# Patient Record
Sex: Female | Born: 1955 | Race: White | Hispanic: No | Marital: Married | Smoking: Never smoker
Health system: Southern US, Community
[De-identification: ages and names within clinical notes are randomized; demographics above are authoritative.]

## PROBLEM LIST (undated history)

## (undated) DIAGNOSIS — H409 Unspecified glaucoma: Secondary | ICD-10-CM

## (undated) HISTORY — PX: SHOULDER SURGERY: SHX246

## (undated) HISTORY — PX: ROTATOR CUFF REPAIR: SHX139

---

## 2012-09-16 ENCOUNTER — Ambulatory Visit: Payer: Self-pay | Admitting: Emergency Medicine

## 2020-02-25 ENCOUNTER — Ambulatory Visit
Admission: EM | Admit: 2020-02-25 | Discharge: 2020-02-25 | Disposition: A | Discharge status: Home / Self Care | Payer: No Typology Code available for payment source | Attending: Internal Medicine | Admitting: Internal Medicine

## 2020-02-25 ENCOUNTER — Ambulatory Visit (INDEPENDENT_AMBULATORY_CARE_PROVIDER_SITE_OTHER): Payer: No Typology Code available for payment source

## 2020-02-25 DIAGNOSIS — S52501A Unspecified fracture of the lower end of right radius, initial encounter for closed fracture: Secondary | ICD-10-CM

## 2020-02-25 DIAGNOSIS — M25531 Pain in right wrist: Secondary | ICD-10-CM

## 2020-02-25 HISTORY — DX: Unspecified glaucoma: H40.9

## 2020-02-25 MED ORDER — IBUPROFEN 600 MG PO TABS: 600.0000 mg | ORAL_TABLET | Freq: Four times a day (QID) | ORAL | 0 refills | Status: AC | PRN

## 2020-02-25 NOTE — ED Provider Notes (Signed)
[facility]    CSN: [id number] Arrival date & time: 02/25/20  1124      History   Chief Complaint Chief Complaint  Patient presents with  . Arm Pain    right    HPI Julie Hunter is a 64 y.o. female comes to the urgent care with complaints of right wrist pain.  Patient was walking on her driveway and she lost a step.  She fell with outstretched hand and started experiencing pain in the right wrist.  Pain is sharp, severe, aggravated by movement and no relieving factors.  Associated with swelling of the right wrist.  Mild bruising of the wrist.  No numbness and tingling in the fingers.  Patient has tried ibuprofen with partial relief.  Fall happened last night. HPI  Past Medical History:  Diagnosis Date  . Glaucoma     There are no problems to display for this patient.   Past Surgical History:  Procedure Laterality Date  . ROTATOR CUFF REPAIR Right   . SHOULDER SURGERY Left     OB History   No obstetric history on file.      Home Medications    Prior to Admission medications   Medication Sig Start Date End Date Taking? Authorizing Provider  aspirin 81 MG chewable tablet Chew by mouth.   Yes [provider]  Cholecalciferol 25 MCG (1000 UT) tablet Take by mouth.   Yes [provider]  cyanocobalamin 1000 MCG tablet Take by mouth.   Yes [provider]  latanoprost (XALATAN) 0.005 % ophthalmic solution Apply to eye. 05/29/17  Yes [provider]  Lutein 20 MG TABS Take by mouth.   Yes [provider]  naproxen (NAPROSYN) 250 MG tablet Take by mouth.   Yes [provider]  Omega-3 Fatty Acids (FISH OIL) 1000 MG CAPS Take 1 capsule by mouth daily.   Yes [provider]  ibuprofen (ADVIL) 600 MG tablet Take 1 tablet (600 mg total) by mouth every 6 (six) hours as needed. 02/25/20   Avner Stroder, Myrene Galas, MD    Family History No family history on file.  Social History Social History   Tobacco  Use  . Smoking status: Never Smoker  . Smokeless tobacco: Never Used  Substance Use Topics  . Alcohol use: Never  . Drug use: Not on file     Allergies   Black walnut pollen allergy skin test, Pecan extract allergy skin test, and Other   Review of Systems Review of Systems  Respiratory: Negative.   Musculoskeletal: Positive for arthralgias and joint swelling. Negative for myalgias, neck pain and neck stiffness.  Skin: Negative.   Neurological: Negative.      Physical Exam Triage Vital Signs ED Triage Vitals  Enc Vitals Group     BP 02/25/20 1250 138/76     Pulse Rate 02/25/20 1250 66     Resp 02/25/20 1250 20     Temp 02/25/20 1250 98.5 F (36.9 C)     Temp Source 02/25/20 1250 Oral     SpO2 02/25/20 1250 100 %     Weight 02/25/20 1253 116 lb (52.6 kg)     Height 02/25/20 1253 5' 1.5" (1.562 m)     Head Circumference --      Peak Flow --      Pain Score 02/25/20 1254 9     Pain Loc --      Pain Edu? --      Excl. in [place]? --  No data found.  Updated Vital Signs BP 138/76 (BP Location: Left Arm)   Pulse 66   Temp 98.5 F (36.9 C) (Oral)   Resp 20   Ht 5' 1.5" (1.562 m)   Wt 52.6 kg   SpO2 100%   BMI 21.56 kg/m   Visual Acuity Right Eye Distance:   Left Eye Distance:   Bilateral Distance:    Right Eye Near:   Left Eye Near:    Bilateral Near:     Physical Exam Vitals and nursing note reviewed.  Constitutional:      General: She is not in acute distress.    Appearance: She is not ill-appearing.  Cardiovascular:     Rate and Rhythm: Normal rate and regular rhythm.     Pulses: Normal pulses.     Heart sounds: Normal heart sounds.  Pulmonary:     Effort: Pulmonary effort is normal.     Breath sounds: Normal breath sounds.  Musculoskeletal:        General: Swelling, tenderness, deformity and signs of injury present. Normal range of motion.  Neurological:     Mental Status: She is alert.      UC Treatments / Results  Labs (all labs  ordered are listed, but only abnormal results are displayed) Labs Reviewed - No data to display  EKG   Radiology DG Wrist Complete Right  Result Date: 02/25/2020 CLINICAL DATA:  [place] as deviating.  Persistent wrist pain. EXAM: RIGHT WRIST - COMPLETE 3+ VIEW COMPARISON:  None. FINDINGS: Nondisplaced distal radius fracture with mild dorsal impaction. No definite intra-articular involvement is identified. No ulnar styloid fracture. The carpal bones are intact.  No metacarpal fractures. IMPRESSION: Nondisplaced distal radius fracture with mild dorsal impaction. No definite intra-articular involvement or associated radial styloid fracture. Electronically Signed   By: Marijo Sanes M.D.   On: 02/25/2020 14:03    Procedures Procedures (including critical care time)  Medications Ordered in UC Medications - No data to display  Initial Impression / Assessment and Plan / UC Course  I have reviewed the triage vital signs and the nursing notes.  Pertinent labs & imaging results that were available during my care of the patient were reviewed by me and considered in my medical decision making (see chart for details).     1.  Closed fracture of the distal end of the right radius: Ibuprofen 600 mg every 6 hours as needed for pain Sugar tong short arm splint Patient has been given information to follow-up with orthopedic surgeon in 1 week Return precautions given Patient notices swelling of the fingers, duskiness of fingers, worsening pain she is advised to return to the urgent care to be reevaluated. Final Clinical Impressions(s) / UC Diagnoses   Final diagnoses:  Closed fracture of distal end of right radius, unspecified fracture morphology, initial encounter   Discharge Instructions   None    ED Prescriptions    Medication Sig Dispense Auth. Provider   ibuprofen (ADVIL) 600 MG tablet Take 1 tablet (600 mg total) by mouth every 6 (six) hours as needed. 30 tablet Rosmery Duggin, Myrene Galas, MD      PDMP not reviewed this encounter.   Chase Picket, MD 02/25/20 [phone]

## 2020-02-25 NOTE — ED Triage Notes (Addendum)
Patient in today w/ c/o right arm pain. Patient states she fell last evening on her arm. Patient has used ice for swelling and an ace wrap.   Patient has not been COVID vaccinated. Patient has decline vaccines at this time.

## 2020-07-19 ENCOUNTER — Other Ambulatory Visit: Payer: Self-pay | Admitting: Interventional Radiology

## 2020-07-19 DIAGNOSIS — C787 Secondary malignant neoplasm of liver and intrahepatic bile duct: Secondary | ICD-10-CM

## 2020-12-29 ENCOUNTER — Telehealth: Payer: No Typology Code available for payment source | Admitting: Medical

## 2021-10-12 IMAGING — CR DG WRIST COMPLETE 3+V*R*
4 series · 4 of 4 positions shown · non-contrast
Comparison: None.

CLINICAL DATA: Fell as deviating.  Persistent wrist pain.

EXAM:
RIGHT WRIST - COMPLETE 3+ VIEW

[wrist pa]
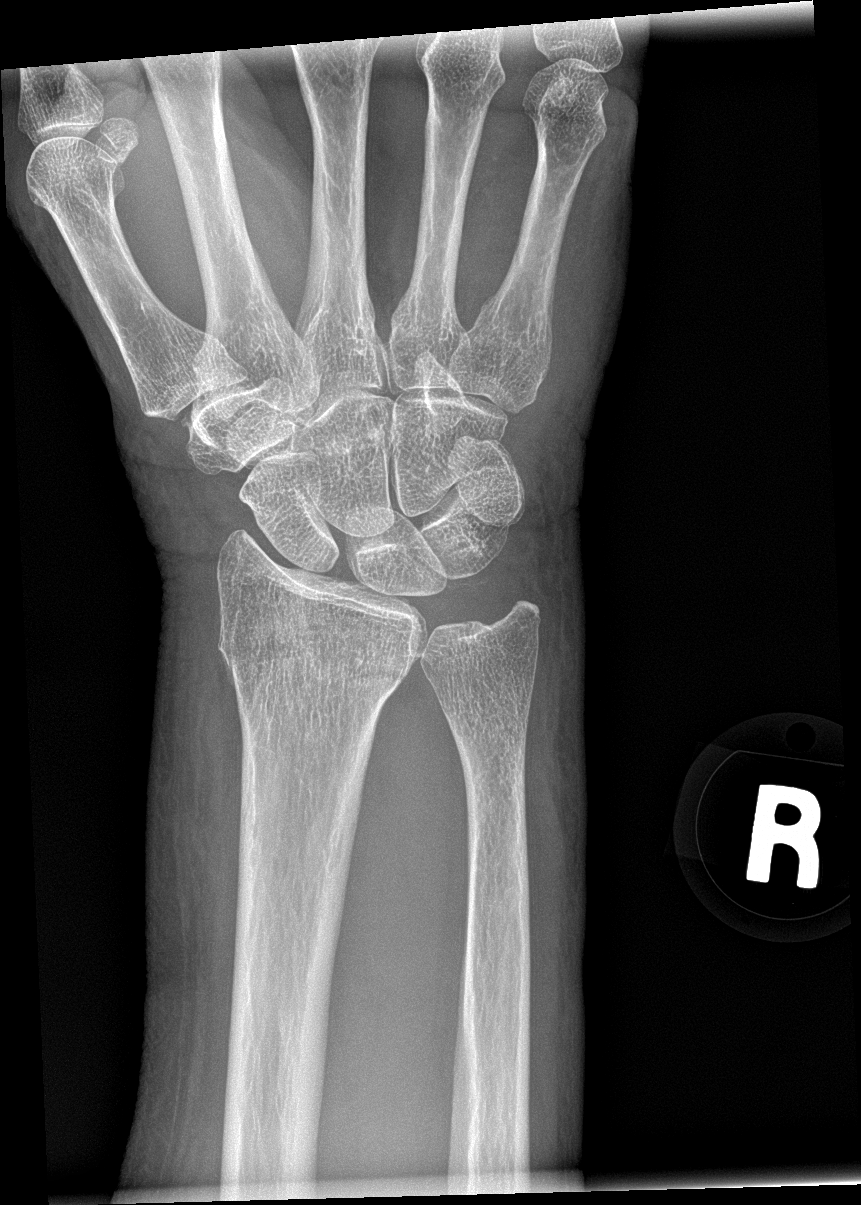

[wrist obl]
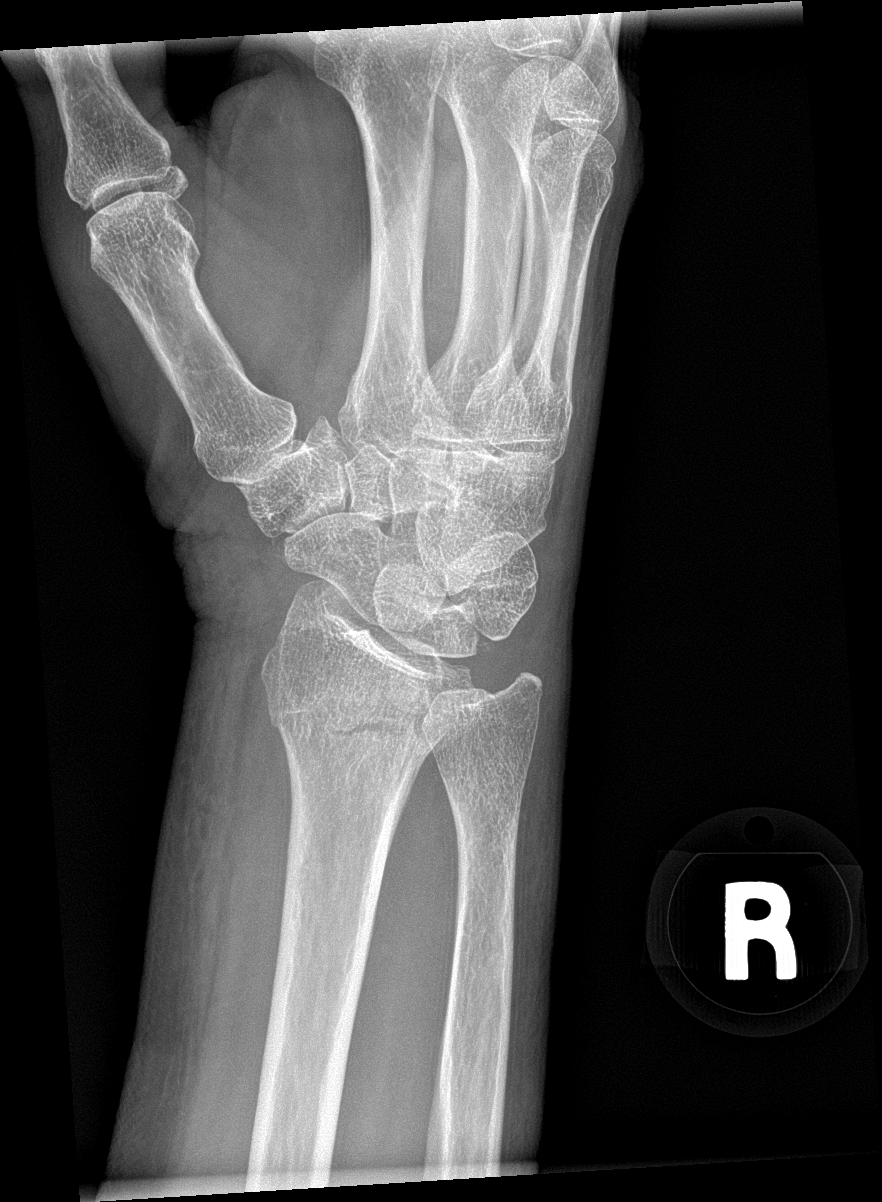

[wrist lat]
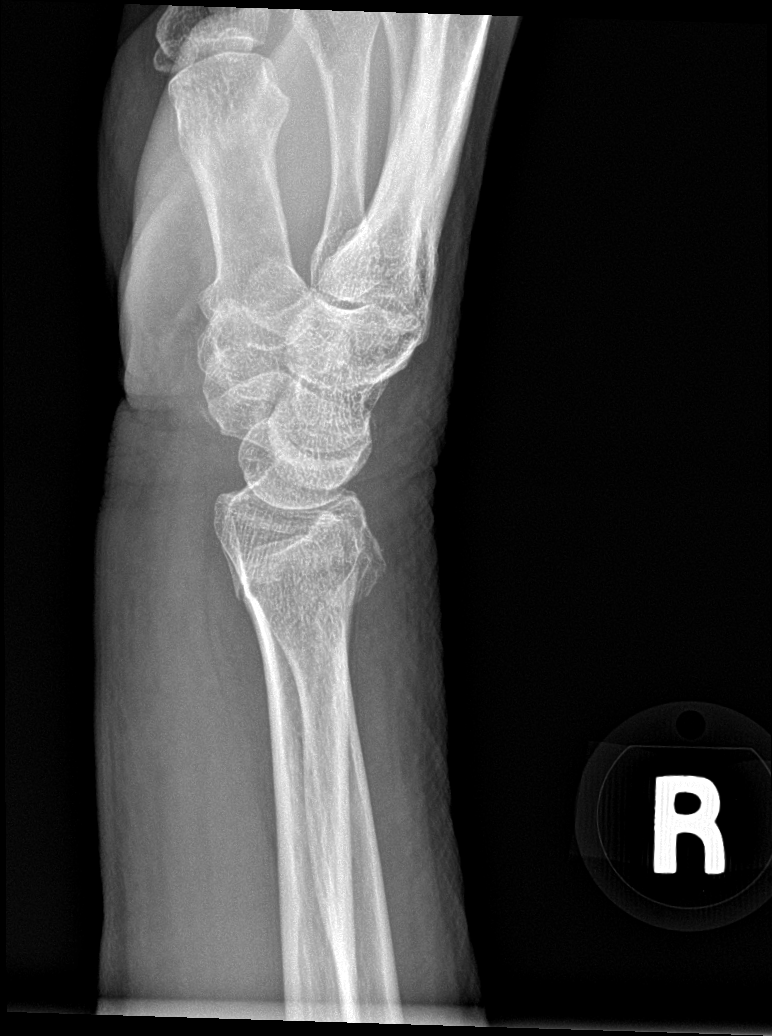

[wrist navicular]
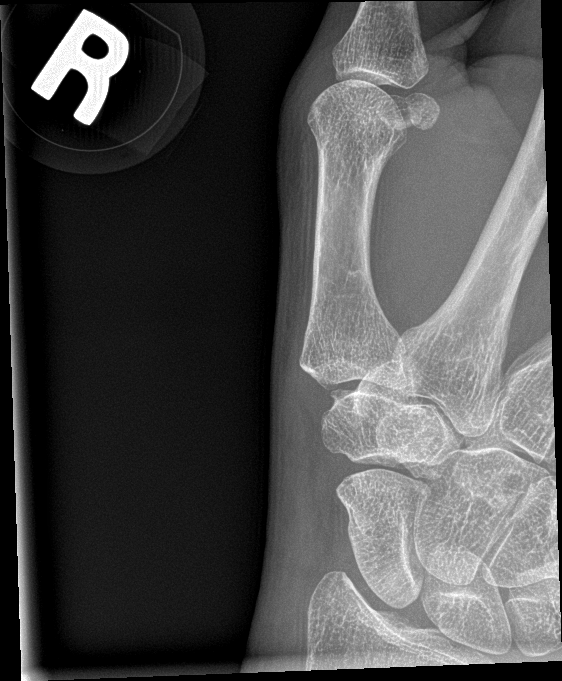

[4 of 4 positions shown; findings below may reference images not displayed]

FINDINGS: Nondisplaced distal radius fracture with mild dorsal impaction. No
definite intra-articular involvement is identified. No ulnar styloid
fracture.

The carpal bones are intact.  No metacarpal fractures.
IMPRESSION: Nondisplaced distal radius fracture with mild dorsal impaction. No
definite intra-articular involvement or associated radial styloid
fracture.

## 2022-02-13 NOTE — Therapy (Signed)
OUTPATIENT PHYSICAL THERAPY VESTIBULAR EVALUATION   Patient Name: Arwa Yero MRN: [id number] DOB:11/09/1955, 66 y.o., female Today's Date: 02/15/2022  PCP: Patient, No Pcp Per REFERRING PROVIDER: Molli Knock, MD   PT End of Session - 02/15/22 1052     Visit Number 1    Number of Visits 17    Date for PT Re-Evaluation 04/12/22    Authorization Type eval: 02/15/22    PT Start Time 1100    PT Stop Time 1145    PT Time Calculation (min) 45 min    Activity Tolerance Patient tolerated treatment well    Behavior During Therapy WFL for tasks assessed/performed             Past Medical History:  Diagnosis Date   Glaucoma    Past Surgical History:  Procedure Laterality Date   ROTATOR CUFF REPAIR Right    SHOULDER SURGERY Left    There are no problems to display for this patient.    PCP: Patient, No Pcp Per  REFERRING PROVIDER: Molli Knock, MD  REFERRING DIAGNOSIS: R42 (ICD-10-CM) - Dizziness and giddiness  THERAPY DIAG: Dizziness and giddiness - Plan: PT plan of care cert/re-cert  RATIONALE FOR EVALUATION AND TREATMENT: Rehabilitation  ONSET DATE: 03/10/2021 (approximate)  FOLLOW UP APPT WITH PROVIDER: No    SUBJECTIVE:   Chief Complaint:  Dizziness and unsteadiness  Pertinent History Pt referred secondary to complaints of occasional dizzy spells as well as imbalance. Symptoms first started sometime before last October and accelerated between her first and second COVID vaccination in the fall. She has noted progressive worsening of her balance as well as progressive difficulty with her short term memory. Pt now tries to hold her husbands arm when walking to facilitate balance and states that if she is holding his arm she is stable. Symptoms never occur when laying down, rolling in bed, or looking up. She denies any true vertigo. 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 Per ENT note pt had a blocked artery several years ago  and was placed on baby aspirin. She was also found to have bilateral sensorineural hearing loss and they are recommending hearing aids. Pt started noticing a blurry spot in her left eye in 2014 and while she was undergoing work-up by optho she had an MRI done which demonstrated brian atrophy, in particular the posterior fossa. She was referred to neurology and at that time she denied ever noticing any memory problems or gait difficulties. Per neurology note pt denied any family history of similar symptoms although mother did have dementia in late-life and father had what she thinks was PSP. She was followed by [place] neurology from 10/17/16 through 04/01/19. 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 Pt denies having seen neurology since 2020. According to the last neurology note there were initially concerns about a neurocognitive or neurodegenerative process. However, she has followed up for 2.5 years and she developed no abnormalities on her examination. 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 She was advised to follow up if she noticed any gait changes, falls, or more objective cognitive difficulties.   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   COMPARISON: None   TECHNIQUE/PROTOCOL: Standard orbits pre and post contrast protocol MRI  performed, including additional imaging pre and post contrast of the brain.   CONTRAST: 63m MultiHance IV. This MRI was performed before and after IV  administration of contrast material. IV contrast was administered to   improve disease detection and further define anatomy.  *    Complications:  No immediate patient complications or events noted.   FINDINGS:  Globes: Normal.  Lenses: Normal appearing native lenses.  Optic nerves: Normal.  Intraconal fat: Normal.  Intraocular hemorrhage: Normal.  Extraocular muscles: Normal.  Intraorbital vasculature: Normal.  Lacrimal glands: Normal.   Cerebrum: No mass, mass effect, acute infarct or hemorrhage. Markedly  advanced for age global cerebral atrophy, which is disproportionately worse  within the pons and medulla.  Intracranial flow voids: Normal.  Extra-axial spaces, basal cisterns, ventricles and sulci: Normal.  Cranium: Normal.  Paranasal sinuses: Normal.  Mastoid sinuses: Normal.   IMPRESSION:  1. Normal MRI of the orbits.  2. 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 This may be seen in multisystem atrophy, but  other etiologies are possible. Correlate clinically.    Description of dizziness: unsteadiness and whoozy Frequency: Multiple times throughout the day Duration: dizziness a few seconds but constant unsteadiness Symptom nature: intermittent, spontaneous, and positional Progression of symptoms since onset: worse, increasing frequency History of similar episodes: No  Provocative Factors: bending forward, sit to stand, turning quickly, no problems with laying down or rolling in bed Easing Factors:  waiting for symptoms to pass.  Auditory complaints (tinnitus, pain, drainage, hearing loss, aural fullness): Yes, pt reports hearing loss and occasional aural fullness but otherwise no changes; Vision changes (diplopia, visual field loss, recent changes, recent eye exam): Yes, chronic double vision intermittently when fatigued (in the last couple months) Chest pain/palpitations: No History of head injury/concussion: No Stress/anxiety: No Numbness/tingling: Denies Focal weakness:  Denies but reports progressive weakness in grip strength; Headaches/migraines: Denies  Has patient fallen in last 6 months? Yes, Number of falls: 1 Pertinent pain: No Dominant hand: right Imaging: Yes  Prior level of function: Independent for ADLs/IADLs Occupational demands: Not working Hobbies: Pt has animals on her property for which she cares  Red Flags: Positive: occasional dysphagia, weight loss (approximately 40# over the last 2-3 years, poor appetite and partially accounted by intentional weight loss), Denies: dysarthria, bowel and bladder changes, chills, fever, or night sweats;  PRECAUTIONS: None  WEIGHT BEARING RESTRICTIONS No  LIVING ENVIRONMENT: Lives with: lives with their spouse Lives in: House/apartment, 2 stories Stairs: Yes; 12-15 stairs inside railing on L during ascend Has following equipment at home: None  PATIENT GOALS: Improve balance, pt wants to be able to walk to the mailbox (gravel driveway about 0.5 miles);   OBJECTIVE  EXAMINATION  POSTURE: No gross deficits contributing to symptoms  NEUROLOGICAL SCREEN: (2+ unless otherwise noted.) N=normal  Ab=abnormal  Level Dermatome R L Myotome R L Reflex R L  C3 Anterior Neck N N Sidebend C2-3 N N Jaw CN V    C4 Top of Shoulder N N Shoulder Shrug C4 N N Hoffman's UMN    C5 Lateral Upper Arm N N Shoulder ABD C4-5 N N Biceps C5-6    C6 Lateral Arm/ Thumb N N Arm Flex/ Wrist Ext C5-6 N N Brachiorad. C5-6    C7 Middle Finger N N Arm Ext//Wrist Flex C6-7 N  N Triceps C7    C8 4th & 5th Finger N N Flex/ Ext Carpi Ulnaris C8 N N Patellar (L3-4)    T1 Medial Arm N N Interossei T1 N N Gastrocnemius    L2 Medial thigh/groin N N Illiopsoas (L2-3) N N     L3 Lower thigh/med.knee N N Quadriceps (L3-4) N N     L4 Medial leg/lat thigh N N Tibialis Ant (L4-5) N N     L5 Lat. leg & dorsal foot N N EHL (L5) N N     S1 post/lat foot/thigh/leg N N Gastrocnemius (S1-2) N N     S2 Post./med. thigh & leg N N Hamstrings (L4-S3)  N N       CRANIAL NERVES II, III, IV, VI: Pupils equal and reactive to light, visual acuity and visual fields are intact, extraocular muscles are intact  V: Facial sensation is intact and symmetric bilaterally  VII: Facial strength is intact and symmetric bilaterally  VIII: Hearing is normal as tested by gross conversation IX, X: Palate elevates midline, normal phonation, uvula midline XI: Shoulder shrug strength is intact  XII: Tongue protrudes midline    SOMATOSENSORY Grossly intact to light touch bilateral UEs/LEs as determined by testing dermatomes C2-T2 and L2-S2. Proprioception and hot/cold testing deferred on this date.   COORDINATION Finger to Nose: Dysmetria bilaterally Heel to Shin: Normal Pronator Drift: Positive on the right Rapid Alternating Movements: Normal Finger to Thumb Opposition: Normal Ataxic gait noted with wide base of support and pt leaning slightly backwards    RANGE OF MOTION Cervical Spine AROM WFL and painless in all planes. No focal deficits in AROM noted in BUE/BLE   MANUAL MUSCLE TESTING BUE/BLE strength WNL without focal deficits with the exception of 4/5 bilateral hip flexion, 4/5 bilateral ankle dorsiflexion, and decreased bilateral grip strength;   TRANSFERS/GAIT Independent for transfers and ambulation without assistive device    PATIENT SURVEYS FOTO: 50, predicted improvement to 58 ABC: To be completed   POSTURAL CONTROL TESTS  Clinical Test of Sensory Interaction for Balance (CTSIB): Deferred  OCULOMOTOR / VESTIBULAR TESTING  Oculomotor Exam- Room Light  Findings Comments  Ocular Alignment normal   Ocular ROM normal   Spontaneous Nystagmus normal   Gaze-Holding Nystagmus normal   End-Gaze Nystagmus normal   Vergence (normal 2-3") not examined   Smooth Pursuit abnormal Saccadic  Cross-Cover Test not examined   Saccades abnormal Slow and consistently hypometric  VOR Cancellation abnormal Consistent saccades  Left Head  Impulse abnormal Corrective saccade required  Right Head Impulse normal   Static Acuity not examined   Dynamic Acuity not examined     Oculomotor Exam- Fixation Suppressed: Deferred   BPPV TESTS:  Symptoms Duration Intensity Nystagmus  L Dix-Hallpike None   Downbeating (does not fatigue)  R Dix-Hallpike Oscillopsia 10-15s  Downbeating (does not fatigue)  L Head Roll None   None  R Head Roll None   None  L Sidelying Test      R Sidelying Test      (blank = not tested)   FUNCTIONAL OUTCOME MEASURES   Results Comments  BERG    DGI    FGA    TUG    5TSTS    6 Minute Walk Test    10 Meter Gait Speed    (blank = not tested)   TODAY'S TREATMENT  None    ASSESSMENT:  CLINICAL IMPRESSION: Patient is a 66 y.o. female who was seen today for physical therapy  evaluation and treatment for dizziness and unsteadiness. Objective impairments include Abnormal gait, decreased balance, decreased coordination, and dizziness. Significant concerns based on examination for central etiology of symptoms including abnormal smooth pursuit finger follow, slow and hypometric saccade testing, positive R pronator drift, bilateral UE dysmetria, ataxic gait, positive VOR cancellation testing, and downbeating nystagmus with oscillopsia in Hallpike position. Additional testing will be performed at first follow-up appointment. Pt was followed by [facility] from 2018-2020 due to concerns about a neurodegenerative process. It was eventually felt like her symptoms were not neurodegenerative in nature however pt was advised to follow-up if she developed any difficulty with unsteadiness and balance and she is now experiencing these symptoms. Therapist recommends pt follow-up with [place] [facility] however she would like to stay local to [facility]. Referral sent to [facility]. These impairments are limiting patient from shopping and community activity. Personal factors including Age,  Past/current experiences, Time since onset of injury/illness/exacerbation, and 1-2 comorbidities: depression and RTC tear  are also affecting patient's functional outcome. Patient will benefit from skilled PT to address above impairments and improve overall function.   REHAB POTENTIAL: Fair    CLINICAL DECISION MAKING: Evolving/moderate complexity  EVALUATION COMPLEXITY: Moderate   GOALS: Goals reviewed with patient? No  SHORT TERM GOALS: Target date: 03/15/2022  Pt will be independent with HEP for dizziness in order to decrease symptoms, improve balance,decrease fall risk, and improve function at home. Baseline: Goal status: INITIAL   LONG TERM GOALS: Target date: 04/12/2022  Pt will increase FOTO to at least 58 to demonstrate significant improvement in function at home related to dizziness.  Baseline: 02/15/22: 50 Goal status: INITIAL  2.  Pt will improve DGI by at least 3 points in order to demonstrate clinically significant improvement in balance and decreased risk for falls.     Baseline: 02/15/22: To be completed Goal status: INITIAL  3.  Pt will improve ABC by at least 13% in order to demonstrate clinically significant improvement in balance confidence.      Baseline: 02/15/22: To be completed Goal status: INITIAL  4. Pt will improve BERG by at least 3 points in order to demonstrate clinically significant improvement in balance and decreased risk for falls.     Baseline: 02/15/22: To be completed Goal status: INITIAL   PLAN:  PT FREQUENCY: 1-2x/week  PT DURATION: 8 weeks  PLANNED INTERVENTIONS: Therapeutic exercises, Therapeutic activity, Neuromuscular re-education, Balance training, Gait training, Patient/Family education, Joint manipulation, Joint mobilization, Canalith repositioning, Aquatic Therapy, Dry Needling, Cognitive remediation, Electrical stimulation, Spinal manipulation, Spinal mobilization, Cryotherapy, Moist heat, Traction, Ultrasound, Ionotophoresis '4mg'$ /ml  Dexamethasone, and Manual therapy  PLAN FOR NEXT SESSION: Have pt complete ABC, BERG, DGI, fixation suppression testing, mCTSIB, consider DVA, follow-up regarding referral to neurology   Lyndel Safe Syrah Daughtrey PT, DPT, GCS  Jheremy Boger 02/15/2022, 2:11 PM

## 2022-02-15 ENCOUNTER — Ambulatory Visit: Payer: Medicare Other | Attending: Otolaryngology

## 2022-02-15 DIAGNOSIS — R42 Dizziness and giddiness: Secondary | ICD-10-CM | POA: Diagnosis present

## 2022-02-15 DIAGNOSIS — R2681 Unsteadiness on feet: Secondary | ICD-10-CM | POA: Insufficient documentation

## 2022-02-16 NOTE — Therapy (Signed)
OUTPATIENT PHYSICAL THERAPY VESTIBULAR TREATMENT   Patient Name: Julie Hunter MRN: [id number] DOB:12/10/1955, 66 y.o., female Today's Date: 02/17/2022  PCP: Patient, No Pcp Per REFERRING PROVIDER: Molli Knock, MD   PT End of Session - 02/17/22 1021     Visit Number 2    Number of Visits 17    Date for PT Re-Evaluation 04/12/22    Authorization Type eval: 02/15/22    PT Start Time 1020    PT Stop Time 1100    PT Time Calculation (min) 40 min    Activity Tolerance Patient tolerated treatment well    Behavior During Therapy WFL for tasks assessed/performed              Past Medical History:  Diagnosis Date   Glaucoma    Past Surgical History:  Procedure Laterality Date   ROTATOR CUFF REPAIR Right    SHOULDER SURGERY Left    There are no problems to display for this patient.    PCP: Patient, No Pcp Per  REFERRING PROVIDER: Molli Knock, MD  REFERRING DIAGNOSIS: R42 (ICD-10-CM) - Dizziness and giddiness  THERAPY DIAG: Unsteadiness on feet  Dizziness and giddiness  RATIONALE FOR EVALUATION AND TREATMENT: Rehabilitation  ONSET DATE: 03/10/2021 (approximate)  FOLLOW UP APPT WITH PROVIDER: No    SUBJECTIVE:   Chief Complaint:  Dizziness and unsteadiness  Pertinent History Pt referred secondary to complaints of occasional dizzy spells as well as imbalance. Symptoms first started sometime before last October and accelerated between her first and second COVID vaccination in the fall. She has noted progressive worsening of her balance as well as progressive difficulty with her short term memory. Pt now tries to hold her husbands arm when walking to facilitate balance and states that if she is holding his arm she is stable. Symptoms never occur when laying down, rolling in bed, or looking up. She denies any true vertigo. 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 Per ENT note pt had a blocked artery several years ago and was  placed on baby aspirin. She was also found to have bilateral sensorineural hearing loss and they are recommending hearing aids. Pt started noticing a blurry spot in her left eye in 2014 and while she was undergoing work-up by optho she had an MRI done which demonstrated brian atrophy, in particular the posterior fossa. She was referred to neurology and at that time she denied ever noticing any memory problems or gait difficulties. Per neurology note pt denied any family history of similar symptoms although mother did have dementia in late-life and father had what she thinks was PSP. She was followed by [facility] from 10/17/16 through 04/01/19. 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 Pt denies having seen neurology since 2020. According to the last neurology note there were initially concerns about a neurocognitive or neurodegenerative process. However, she has followed up for 2.5 years and she developed no abnormalities on her examination. 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 She was advised to follow up if she noticed any gait changes, falls, or more objective cognitive difficulties.   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   COMPARISON: None   TECHNIQUE/PROTOCOL: Standard orbits pre and post contrast protocol MRI  performed, including additional imaging pre and post contrast of the brain.   CONTRAST: 68m MultiHance IV. This MRI was performed before and after IV  administration of contrast material. IV contrast was administered to  improve  disease detection and further define anatomy.  *    Complications:  No immediate patient complications or events noted.   FINDINGS:  Globes: Normal.  Lenses: Normal appearing native lenses.  Optic nerves: Normal.  Intraconal fat: Normal.  Intraocular hemorrhage: Normal.  Extraocular muscles: Normal.  Intraorbital vasculature: Normal.  Lacrimal glands: Normal.   Cerebrum: No mass, mass effect, acute infarct or hemorrhage. Markedly  advanced for age global cerebral atrophy, which is disproportionately worse  within the pons and medulla.  Intracranial flow voids: Normal.  Extra-axial spaces, basal cisterns, ventricles and sulci: Normal.  Cranium: Normal.  Paranasal sinuses: Normal.  Mastoid sinuses: Normal.   IMPRESSION:  1. Normal MRI of the orbits.  2. 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 This may be seen in multisystem atrophy, but  other etiologies are possible. Correlate clinically.    Description of dizziness: unsteadiness and whoozy Frequency: Multiple times throughout the day Duration: dizziness a few seconds but constant unsteadiness Symptom nature: intermittent, spontaneous, and positional Progression of symptoms since onset: worse, increasing frequency History of similar episodes: No  Provocative Factors: bending forward, sit to stand, turning quickly, no problems with laying down or rolling in bed Easing Factors:  waiting for symptoms to pass.  Auditory complaints (tinnitus, pain, drainage, hearing loss, aural fullness): Yes, pt reports hearing loss and occasional aural fullness but otherwise no changes; Vision changes (diplopia, visual field loss, recent changes, recent eye exam): Yes, chronic double vision intermittently when fatigued (in the last couple months) Chest pain/palpitations: No History of head injury/concussion: No Stress/anxiety: No Numbness/tingling: Denies Focal weakness: Denies but  reports progressive weakness in grip strength; Headaches/migraines: Denies  Has patient fallen in last 6 months? Yes, Number of falls: 1 Pertinent pain: No Dominant hand: right Imaging: Yes  Prior level of function: Independent for ADLs/IADLs Occupational demands: Not working Hobbies: Pt has animals on her property for which she cares  Red Flags: Positive: occasional dysphagia, weight loss (approximately 40# over the last 2-3 years, poor appetite and partially accounted by intentional weight loss), Denies: dysarthria, bowel and bladder changes, chills, fever, or night sweats;  PRECAUTIONS: None  WEIGHT BEARING RESTRICTIONS No  LIVING ENVIRONMENT: Lives with: lives with their spouse Lives in: House/apartment, 2 stories Stairs: Yes; 12-15 stairs inside railing on L during ascend Has following equipment at home: None  PATIENT GOALS: Improve balance, pt wants to be able to walk to the mailbox (gravel driveway about 0.5 miles);   OBJECTIVE  EXAMINATION  POSTURE: No gross deficits contributing to symptoms  NEUROLOGICAL SCREEN: (2+ unless otherwise noted.) N=normal  Ab=abnormal  Level Dermatome R L Myotome R L Reflex R L  C3 Anterior Neck N N Sidebend C2-3 N N Jaw CN V    C4 Top of Shoulder N N Shoulder Shrug C4 N N Hoffman's UMN    C5 Lateral Upper Arm N N Shoulder ABD C4-5 N N Biceps C5-6    C6 Lateral Arm/ Thumb N N Arm Flex/ Wrist Ext C5-6 N N Brachiorad. C5-6    C7 Middle Finger N N Arm Ext//Wrist Flex C6-7 N N Triceps  C7    C8 4th & 5th Finger N N Flex/ Ext Carpi Ulnaris C8 N N Patellar (L3-4)    T1 Medial Arm N N Interossei T1 N N Gastrocnemius    L2 Medial thigh/groin N N Illiopsoas (L2-3) N N     L3 Lower thigh/med.knee N N Quadriceps (L3-4) N N     L4 Medial leg/lat thigh N N Tibialis Ant (L4-5) N N     L5 Lat. leg & dorsal foot N N EHL (L5) N N     S1 post/lat foot/thigh/leg N N Gastrocnemius (S1-2) N N     S2 Post./med. thigh & leg N N Hamstrings (L4-S3) N N        CRANIAL NERVES II, III, IV, VI: Pupils equal and reactive to light, visual acuity and visual fields are intact, extraocular muscles are intact  V: Facial sensation is intact and symmetric bilaterally  VII: Facial strength is intact and symmetric bilaterally  VIII: Hearing is normal as tested by gross conversation IX, X: Palate elevates midline, normal phonation, uvula midline XI: Shoulder shrug strength is intact  XII: Tongue protrudes midline    SOMATOSENSORY Grossly intact to light touch bilateral UEs/LEs as determined by testing dermatomes C2-T2 and L2-S2. Proprioception and hot/cold testing deferred on this date.   COORDINATION Finger to Nose: Dysmetria bilaterally Heel to Shin: Normal Pronator Drift: Positive on the right Rapid Alternating Movements: Normal Finger to Thumb Opposition: Normal Ataxic gait noted with wide base of support and pt leaning slightly backwards    RANGE OF MOTION Cervical Spine AROM WFL and painless in all planes. No focal deficits in AROM noted in BUE/BLE   MANUAL MUSCLE TESTING BUE/BLE strength WNL without focal deficits with the exception of 4/5 bilateral hip flexion, 4/5 bilateral ankle dorsiflexion, and decreased bilateral grip strength;   TRANSFERS/GAIT Independent for transfers and ambulation without assistive device    PATIENT SURVEYS FOTO: 50, predicted improvement to 58 ABC: To be completed   POSTURAL CONTROL TESTS  Clinical Test of Sensory Interaction for Balance (CTSIB): Deferred  OCULOMOTOR / VESTIBULAR TESTING  Oculomotor Exam- Room Light  Findings Comments  Ocular Alignment normal   Ocular ROM normal   Spontaneous Nystagmus normal   Gaze-Holding Nystagmus normal   End-Gaze Nystagmus normal   Vergence (normal 2-3") not examined   Smooth Pursuit abnormal Saccadic  Cross-Cover Test not examined   Saccades abnormal Slow and consistently hypometric  VOR Cancellation abnormal Consistent saccades  Left Head Impulse  abnormal Corrective saccade required  Right Head Impulse normal   Static Acuity not examined   Dynamic Acuity not examined     Oculomotor Exam- Fixation Suppressed: Deferred   BPPV TESTS:  Symptoms Duration Intensity Nystagmus  L Dix-Hallpike None   Downbeating (does not fatigue)  R Dix-Hallpike Oscillopsia 10-15s  Downbeating (does not fatigue)  L Head Roll None   None  R Head Roll None   None  L Sidelying Test      R Sidelying Test      (blank = not tested)   FUNCTIONAL OUTCOME MEASURES   Results Comments  BERG    DGI    FGA    TUG    5TSTS    6 Minute Walk Test    10 Meter Gait Speed    (blank = not tested)   TODAY'S TREATMENT   SUBJECTIVE: Pt states that she was bit by a lonestar tick in 2018 and she developed a meat allergy afterwards. She attributes  this to her weight loss over the last few years. She states that she never was tested afterwards for any tick-borne illness. She also reports that she forgot to report a fall in 2021 where she fractured her R forearm. She has not heart from neurology yet.     PAIN: Denies   Neuromuscular Re-education  NuStep L1-2 x 5 minutes for warm-up during history;  Additional outcome measures updated with patient: ABC: 48.1% BERG: 51/56 5TSTS: 8.9s FGA: 17/30  Modified Clinical Test of Sensory Interaction for Balance (mCTSIB):  CONDITION TIME SWAY  Eyes open, firm surface 30 seconds 2+  Eyes closed, firm surface 30 seconds 3+  Eyes open, foam surface 30 seconds 3+  Eyes closed, foam surface 6 seconds 4+    Oculomotor Exam- Fixation Suppressed  Findings Comments  Ocular Alignment normal   Spontaneous Nystagmus abnormal R horizontal beating nystagmus  Gaze-Holding Nystagmus abnormal Worsening R horizontal beating nystagmus with R gaze, Downbeating nystagmus with L gaze  End-Gaze Nystagmus abnormal See above  Head Shaking Nystagmus No change   Pressure-Induced Nystagmus No change   Hyperventilation Induced  Nystagmus not examined   Skull Vibration Induced Nystagmus not examined     PATIENT EDUCATION:  Education details: Results of outcome measures and additional testing; Person educated: Patient Education method: Explanation Education comprehension: verbalized understanding   ASSESSMENT:  CLINICAL IMPRESSION: Performed additional outcome measures testing with patient during visit today. She reports a very low balance confidence scoring 48.1% on the ABC Scale. Her static balance is fair to good with pt scoring 51/56 on the BERG however her dynamic balance on the FGA is severely impaired with pt scoring 17/30. She struggles the most with head turns during ambulation, eyes closed gait, and tandem stepping. She maintains her balance for 30s in conditions 1-3 of the mCTSIB however with significant sway with eyes closed and on foam with eyes open. She is only able to maintain her balance for 6s in condition 4 (foam, eyes closed). Performed oculomotor/vestibular fixation suppression testing during visit and pt demonstrates a spontaneous horizontal beating nystagmus which continues during R gaze however converts to downbeating during left gaze. Pt has not heard from neurology so phone number provided for patient to follow-up early next week if she has not received a call. Will perform MOCA test at next appointment and consider DVA. Will also plan to initiate balance exercises and issue HEP. Patient will benefit from skilled PT to address above impairments and improve overall function.   REHAB POTENTIAL: Fair    CLINICAL DECISION MAKING: Evolving/moderate complexity  EVALUATION COMPLEXITY: Moderate   GOALS: Goals reviewed with patient? No  SHORT TERM GOALS: Target date: 03/15/2022  Pt will be independent with HEP for dizziness in order to decrease symptoms, improve balance,decrease fall risk, and improve function at home. Baseline: Goal status: INITIAL   LONG TERM GOALS: Target date:  04/12/2022  Pt will increase FOTO to at least 58 to demonstrate significant improvement in function at home related to dizziness.  Baseline: 02/15/22: 50 Goal status: INITIAL  2.  Pt will improve FGA by at least 4 points in order to demonstrate clinically significant improvement in balance and decreased risk for falls.     Baseline: 02/15/22: To be completed; 02/17/22: 17/30 Goal status: Revised  3.  Pt will improve ABC by at least 13% in order to demonstrate clinically significant improvement in balance confidence.      Baseline: 02/15/22: To be completed; 02/17/22: 48.1% Goal status: INITIAL  4. Pt  will improve BERG by at least 3 points in order to demonstrate clinically significant improvement in balance and decreased risk for falls.     Baseline: 02/15/22: To be completed, 02/17/22: 51/56 Goal status: INITIAL   PLAN:  PT FREQUENCY: 1-2x/week  PT DURATION: 8 weeks  PLANNED INTERVENTIONS: Therapeutic exercises, Therapeutic activity, Neuromuscular re-education, Balance training, Gait training, Patient/Family education, Joint manipulation, Joint mobilization, Canalith repositioning, Aquatic Therapy, Dry Needling, Cognitive remediation, Electrical stimulation, Spinal manipulation, Spinal mobilization, Cryotherapy, Moist heat, Traction, Ultrasound, Ionotophoresis '4mg'$ /ml Dexamethasone, and Manual therapy  PLAN FOR NEXT SESSION:  consider DVA, MOCA, Initiate balance therapy with patient, follow-up regarding referral to neurology   Lyndel Safe Ricquel Foulk PT, DPT, GCS  Ammie Warrick 02/17/2022, 11:32 AM

## 2022-02-17 ENCOUNTER — Ambulatory Visit: Payer: Medicare Other

## 2022-02-17 DIAGNOSIS — R42 Dizziness and giddiness: Secondary | ICD-10-CM | POA: Diagnosis not present

## 2022-02-17 DIAGNOSIS — R2681 Unsteadiness on feet: Secondary | ICD-10-CM

## 2022-02-20 NOTE — Therapy (Signed)
OUTPATIENT PHYSICAL THERAPY VESTIBULAR TREATMENT   Patient Name: Julie Hunter MRN: [id number] DOB:[birth year], 66 y.o., female Today's Date: 02/21/2022  PCP: Patient, No Pcp Per REFERRING PROVIDER: Molli Knock, MD   PT End of Session - 02/21/22 1300     Visit Number 3    Number of Visits 17    Date for PT Re-Evaluation 04/12/22    Authorization Type eval: 02/15/22    PT Start Time 1015    PT Stop Time 1100    PT Time Calculation (min) 45 min    Activity Tolerance Patient tolerated treatment well    Behavior During Therapy WFL for tasks assessed/performed               Past Medical History:  Diagnosis Date   Glaucoma    Past Surgical History:  Procedure Laterality Date   ROTATOR CUFF REPAIR Right    SHOULDER SURGERY Left    There are no problems to display for this patient.    PCP: Patient, No Pcp Per  REFERRING PROVIDER: Molli Knock, MD  REFERRING DIAGNOSIS: R42 (ICD-10-CM) - Dizziness and giddiness  THERAPY DIAG: Unsteadiness on feet  Dizziness and giddiness  RATIONALE FOR EVALUATION AND TREATMENT: Rehabilitation  ONSET DATE: 03/10/2021 (approximate)  FOLLOW UP APPT WITH PROVIDER: No    SUBJECTIVE:   Chief Complaint:  Dizziness and unsteadiness  Pertinent History Pt referred secondary to complaints of occasional dizzy spells as well as imbalance. Symptoms first started sometime before last October and accelerated between her first and second COVID vaccination in the fall. She has noted progressive worsening of her balance as well as progressive difficulty with her short term memory. Pt now tries to hold her husbands arm when walking to facilitate balance and states that if she is holding his arm she is stable. Symptoms never occur when laying down, rolling in bed, or looking up. She denies any true vertigo. 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 Per ENT note pt had a blocked artery several years ago and was  placed on baby aspirin. She was also found to have bilateral sensorineural hearing loss and they are recommending hearing aids. Pt started noticing a blurry spot in her left eye in 2014 and while she was undergoing work-up by optho she had an MRI done which demonstrated brian atrophy, in particular the posterior fossa. She was referred to neurology and at that time she denied ever noticing any memory problems or gait difficulties. Per neurology note pt denied any family history of similar symptoms although mother did have dementia in late-life and father had what she thinks was PSP. She was followed by [facility] from 10/17/16 through 04/01/19. 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 Pt denies having seen neurology since 2020. According to the last neurology note there were initially concerns about a neurocognitive or neurodegenerative process. However, she has followed up for 2.5 years and she developed no abnormalities on her examination. 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 She was advised to follow up if she noticed any gait changes, falls, or more objective cognitive difficulties.   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   COMPARISON: None   TECHNIQUE/PROTOCOL: Standard orbits pre and post contrast protocol MRI  performed, including additional imaging pre and post contrast of the brain.   CONTRAST: 20m MultiHance IV. This MRI was performed before and after IV  administration of contrast material. IV contrast was administered to  improve  disease detection and further define anatomy.  *    Complications:  No immediate patient complications or events noted.   FINDINGS:  Globes: Normal.  Lenses: Normal appearing native lenses.  Optic nerves: Normal.  Intraconal fat: Normal.  Intraocular hemorrhage: Normal.  Extraocular muscles: Normal.  Intraorbital vasculature: Normal.  Lacrimal glands: Normal.   Cerebrum: No mass, mass effect, acute infarct or hemorrhage. Markedly  advanced for age global cerebral atrophy, which is disproportionately worse  within the pons and medulla.  Intracranial flow voids: Normal.  Extra-axial spaces, basal cisterns, ventricles and sulci: Normal.  Cranium: Normal.  Paranasal sinuses: Normal.  Mastoid sinuses: Normal.   IMPRESSION:  1. Normal MRI of the orbits.  2. 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 This may be seen in multisystem atrophy, but  other etiologies are possible. Correlate clinically.    Description of dizziness: unsteadiness and whoozy Frequency: Multiple times throughout the day Duration: dizziness a few seconds but constant unsteadiness Symptom nature: intermittent, spontaneous, and positional Progression of symptoms since onset: worse, increasing frequency History of similar episodes: No  Provocative Factors: bending forward, sit to stand, turning quickly, no problems with laying down or rolling in bed Easing Factors:  waiting for symptoms to pass.  Auditory complaints (tinnitus, pain, drainage, hearing loss, aural fullness): Yes, pt reports hearing loss and occasional aural fullness but otherwise no changes; Vision changes (diplopia, visual field loss, recent changes, recent eye exam): Yes, chronic double vision intermittently when fatigued (in the last couple months) Chest pain/palpitations: No History of head injury/concussion: No Stress/anxiety: No Numbness/tingling: Denies Focal weakness: Denies but  reports progressive weakness in grip strength; Headaches/migraines: Denies  Has patient fallen in last 6 months? Yes, Number of falls: 1 Pertinent pain: No Dominant hand: right Imaging: Yes  Prior level of function: Independent for ADLs/IADLs Occupational demands: Not working Hobbies: Pt has animals on her property for which she cares  Red Flags: Positive: occasional dysphagia, weight loss (approximately 40# over the last 2-3 years, poor appetite and partially accounted by intentional weight loss), Denies: dysarthria, bowel and bladder changes, chills, fever, or night sweats;  PRECAUTIONS: None  WEIGHT BEARING RESTRICTIONS No  LIVING ENVIRONMENT: Lives with: lives with their spouse Lives in: House/apartment, 2 stories Stairs: Yes; 12-15 stairs inside railing on L during ascend Has following equipment at home: None  PATIENT GOALS: Improve balance, pt wants to be able to walk to the mailbox (gravel driveway about 0.5 miles);   OBJECTIVE  EXAMINATION  POSTURE: No gross deficits contributing to symptoms  NEUROLOGICAL SCREEN: (2+ unless otherwise noted.) N=normal  Ab=abnormal  Level Dermatome R L Myotome R L Reflex R L  C3 Anterior Neck N N Sidebend C2-3 N N Jaw CN V    C4 Top of Shoulder N N Shoulder Shrug C4 N N Hoffman's UMN    C5 Lateral Upper Arm N N Shoulder ABD C4-5 N N Biceps C5-6    C6 Lateral Arm/ Thumb N N Arm Flex/ Wrist Ext C5-6 N N Brachiorad. C5-6    C7 Middle Finger N N Arm Ext//Wrist Flex C6-7 N N  Triceps C7    C8 4th & 5th Finger N N Flex/ Ext Carpi Ulnaris C8 N N Patellar (L3-4)    T1 Medial Arm N N Interossei T1 N N Gastrocnemius    L2 Medial thigh/groin N N Illiopsoas (L2-3) N N     L3 Lower thigh/med.knee N N Quadriceps (L3-4) N N     L4 Medial leg/lat thigh N N Tibialis Ant (L4-5) N N     L5 Lat. leg & dorsal foot N N EHL (L5) N N     S1 post/lat foot/thigh/leg N N Gastrocnemius (S1-2) N N     S2 Post./med. thigh & leg N N Hamstrings (L4-S3) N N        CRANIAL NERVES II, III, IV, VI: Pupils equal and reactive to light, visual acuity and visual fields are intact, extraocular muscles are intact  V: Facial sensation is intact and symmetric bilaterally  VII: Facial strength is intact and symmetric bilaterally  VIII: Hearing is normal as tested by gross conversation IX, X: Palate elevates midline, normal phonation, uvula midline XI: Shoulder shrug strength is intact  XII: Tongue protrudes midline    SOMATOSENSORY Grossly intact to light touch bilateral UEs/LEs as determined by testing dermatomes C2-T2 and L2-S2. Proprioception and hot/cold testing deferred on this date.   COORDINATION Finger to Nose: Dysmetria bilaterally Heel to Shin: Normal Pronator Drift: Positive on the right Rapid Alternating Movements: Normal Finger to Thumb Opposition: Normal Ataxic gait noted with wide base of support and pt leaning slightly backwards    RANGE OF MOTION Cervical Spine AROM WFL and painless in all planes. No focal deficits in AROM noted in BUE/BLE   MANUAL MUSCLE TESTING BUE/BLE strength WNL without focal deficits with the exception of 4/5 bilateral hip flexion, 4/5 bilateral ankle dorsiflexion, and decreased bilateral grip strength;   TRANSFERS/GAIT Independent for transfers and ambulation without assistive device    PATIENT SURVEYS FOTO: 50, predicted improvement to 58 ABC: To be completed, 02/17/22: 48.1%   POSTURAL CONTROL TESTS  Clinical Test of Sensory Interaction for Balance (CTSIB): Deferred  OCULOMOTOR / VESTIBULAR TESTING  Oculomotor Exam- Room Light  Findings Comments  Ocular Alignment normal   Ocular ROM normal   Spontaneous Nystagmus normal   Gaze-Holding Nystagmus normal   End-Gaze Nystagmus normal   Vergence (normal 2-3") not examined   Smooth Pursuit abnormal Saccadic  Cross-Cover Test not examined   Saccades abnormal Slow and consistently hypometric  VOR Cancellation abnormal Consistent saccades   Left Head Impulse abnormal Corrective saccade required  Right Head Impulse normal   Static Acuity not examined   Dynamic Acuity not examined     Oculomotor Exam- Fixation Suppressed: Deferred   BPPV TESTS:  Symptoms Duration Intensity Nystagmus  L Dix-Hallpike None   Downbeating (does not fatigue)  R Dix-Hallpike Oscillopsia 10-15s  Downbeating (does not fatigue)  L Head Roll None   None  R Head Roll None   None  L Sidelying Test      R Sidelying Test      (blank = not tested)   Modified Clinical Test of Sensory Interaction for Balance (mCTSIB), 02/17/22:  CONDITION TIME SWAY  Eyes open, firm surface 30 seconds 2+  Eyes closed, firm surface 30 seconds 3+  Eyes open, foam surface 30 seconds 3+  Eyes closed, foam surface 6 seconds 4+    Oculomotor Exam- Fixation Suppressed (02/17/22)  Findings Comments  Ocular Alignment normal   Spontaneous Nystagmus abnormal R horizontal beating nystagmus  Gaze-Holding Nystagmus abnormal Worsening R horizontal beating nystagmus with R gaze, Downbeating nystagmus with L gaze  End-Gaze Nystagmus abnormal See above  Head Shaking Nystagmus No change   Pressure-Induced Nystagmus No change   Hyperventilation Induced Nystagmus not examined   Skull Vibration Induced Nystagmus not examined     FUNCTIONAL OUTCOME MEASURES   02/17/22 02/21/22 Comments  BERG 51/56  Mild balance deficit  DGI     FGA 17/30  Severe dynamic instability  TUG     5TSTS 8.9s  WNL  6 Minute Walk Test     10 Meter Gait Speed     MOCA  24/30   (blank = not tested)   TODAY'S TREATMENT   SUBJECTIVE: Pt states that she is doing well today. She continues with dizziness and imbalance. She has not heard from neurology yet and has not called to schedule and evaluation. She is planning to call soon if she doesn't hear from them. No specific questions or concerns currently.     PAIN: Denies   Neuromuscular Re-education  All balance exercises performed in // bars without  UE support unless otherwise specified: NuStep L1-2 x 7 minutes for warm-up during interval history; Completed MOCA with patient who scored: 24/30 Feet together eyes open/closed x 30s each; Feet together horizontal and vertical head turns x 30s each; 6" alternating toe taps x 15 BLE; Airex 6" alternating toe taps x 15 BLE; Airex feet together eyes open/closed x 30s each; Airex feet together horizontal and vertical head turns x 30s each; Tandem balance alternating forward LE x 30s each; Single leg balance x 30s on each LE; Issued and reviewed HEP with patient;   PATIENT EDUCATION:  Education details: Balance exercises, HEP Person educated: Patient Education method: Explanation and handout Education comprehension: verbalized understanding    HOME EXERCISE PROGRAM: Access Code: [email or web address] URL: [email or web address] Date: 02/21/2022 Prepared by: Roxana Hires  Exercises - Standing Tandem Balance with Counter Support  - 2 x daily - 7 x weekly - 3 x 30s with each foot forward hold - Standing Single Leg Stance with Counter Support  - 2 x daily - 7 x weekly - 3 x 30s on each foot hold   ASSESSMENT:  CLINICAL IMPRESSION: Performed MOCA testing with patient during visit today and she scored 24/30. Her most significant deficits were related to visuospatial/executive patterns and delayed recall. Additional time during session spent on balance exercises. Created HEP and issued to patient. Pt encouraged to call neurology to schedule an evaluation. Patient will benefit from skilled PT to address above impairments and improve overall function.   REHAB POTENTIAL: Fair    CLINICAL DECISION MAKING: Evolving/moderate complexity  EVALUATION COMPLEXITY: Moderate   GOALS: Goals reviewed with patient? No  SHORT TERM GOALS: Target date: 03/15/2022  Pt will be independent with HEP for dizziness in order to decrease symptoms, improve balance,decrease fall risk, and improve function  at home. Baseline: Goal status: INITIAL   LONG TERM GOALS: Target date: 04/12/2022  Pt will increase FOTO to at least 58 to demonstrate significant improvement in function at home related to dizziness.  Baseline: 02/15/22: 50 Goal status: INITIAL  2.  Pt will improve FGA by at least 4 points in order to demonstrate clinically significant improvement in balance and decreased risk for falls.     Baseline: 02/15/22: To be completed; 02/17/22: 17/30 Goal status: Revised  3.  Pt will improve ABC by at least 13% in order to demonstrate clinically significant improvement in balance confidence.  Baseline: 02/15/22: To be completed; 02/17/22: 48.1% Goal status: INITIAL  4. Pt will improve BERG by at least 3 points in order to demonstrate clinically significant improvement in balance and decreased risk for falls.     Baseline: 02/15/22: To be completed, 02/17/22: 51/56 Goal status: INITIAL   PLAN:  PT FREQUENCY: 1-2x/week  PT DURATION: 8 weeks  PLANNED INTERVENTIONS: Therapeutic exercises, Therapeutic activity, Neuromuscular re-education, Balance training, Gait training, Patient/Family education, Joint manipulation, Joint mobilization, Canalith repositioning, Aquatic Therapy, Dry Needling, Cognitive remediation, Electrical stimulation, Spinal manipulation, Spinal mobilization, Cryotherapy, Moist heat, Traction, Ultrasound, Ionotophoresis '4mg'$ /ml Dexamethasone, and Manual therapy  PLAN FOR NEXT SESSION:  consider DVA, continue balance therapy with patient, follow-up regarding referral to neurology   Lyndel Safe Nattaly Yebra PT, DPT, GCS  Ladora Osterberg 02/21/2022, 1:06 PM

## 2022-02-21 ENCOUNTER — Ambulatory Visit: Payer: Medicare Other

## 2022-02-21 DIAGNOSIS — R42 Dizziness and giddiness: Secondary | ICD-10-CM

## 2022-02-21 DIAGNOSIS — R2681 Unsteadiness on feet: Secondary | ICD-10-CM

## 2022-02-22 ENCOUNTER — Ambulatory Visit: Payer: Medicare Other

## 2022-02-22 NOTE — Therapy (Incomplete)
OUTPATIENT PHYSICAL THERAPY VESTIBULAR TREATMENT   Patient Name: Julie Hunter MRN: [id number] DOB:1956-05-10, 66 y.o., female Today's Date: 02/23/2022  PCP: Patient, No Pcp Per REFERRING PROVIDER: Molli Knock, MD   PT End of Session - 02/23/22 1621     Visit Number 4    Number of Visits 17    Date for PT Re-Evaluation 04/12/22    Authorization Type eval: 02/15/22    PT Start Time 1615    PT Stop Time 1700    PT Time Calculation (min) 45 min    Activity Tolerance Patient tolerated treatment well    Behavior During Therapy WFL for tasks assessed/performed              Past Medical History:  Diagnosis Date   Glaucoma    Past Surgical History:  Procedure Laterality Date   ROTATOR CUFF REPAIR Right    SHOULDER SURGERY Left    There are no problems to display for this patient.    PCP: Patient, No Pcp Per  REFERRING PROVIDER: Molli Knock, MD  REFERRING DIAGNOSIS: R42 (ICD-10-CM) - Dizziness and giddiness  THERAPY DIAG: Unsteadiness on feet  Dizziness and giddiness  RATIONALE FOR EVALUATION AND TREATMENT: Rehabilitation  ONSET DATE: 03/10/2021 (approximate)  FOLLOW UP APPT WITH PROVIDER: No    SUBJECTIVE:   Chief Complaint:  Dizziness and unsteadiness  Pertinent History Pt referred secondary to complaints of occasional dizzy spells as well as imbalance. Symptoms first started sometime before last October and accelerated between her first and second COVID vaccination in the fall. She has noted progressive worsening of her balance as well as progressive difficulty with her short term memory. Pt now tries to hold her husbands arm when walking to facilitate balance and states that if she is holding his arm she is stable. Symptoms never occur when laying down, rolling in bed, or looking up. She denies any true vertigo. 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 Per ENT note pt had a blocked artery several years ago and was  placed on baby aspirin. She was also found to have bilateral sensorineural hearing loss and they are recommending hearing aids. Pt started noticing a blurry spot in her left eye in 2014 and while she was undergoing work-up by optho she had an MRI done which demonstrated brian atrophy, in particular the posterior fossa. She was referred to neurology and at that time she denied ever noticing any memory problems or gait difficulties. Per neurology note pt denied any family history of similar symptoms although mother did have dementia in late-life and father had what she thinks was PSP. She was followed by [facility] from 10/17/16 through 04/01/19. 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 Pt denies having seen neurology since 2020. According to the last neurology note there were initially concerns about a neurocognitive or neurodegenerative process. However, she has followed up for 2.5 years and she developed no abnormalities on her examination. 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 She was advised to follow up if she noticed any gait changes, falls, or more objective cognitive difficulties.   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   COMPARISON: None   TECHNIQUE/PROTOCOL: Standard orbits pre and post contrast protocol MRI  performed, including additional imaging pre and post contrast of the brain.   CONTRAST: 51m MultiHance IV. This MRI was performed before and after IV  administration of contrast material. IV contrast was administered to  improve  disease detection and further define anatomy.  *    Complications:  No immediate patient complications or events noted.   FINDINGS:  Globes: Normal.  Lenses: Normal appearing native lenses.  Optic nerves: Normal.  Intraconal fat: Normal.  Intraocular hemorrhage: Normal.  Extraocular muscles: Normal.  Intraorbital vasculature: Normal.  Lacrimal glands: Normal.   Cerebrum: No mass, mass effect, acute infarct or hemorrhage. Markedly  advanced for age global cerebral atrophy, which is disproportionately worse  within the pons and medulla.  Intracranial flow voids: Normal.  Extra-axial spaces, basal cisterns, ventricles and sulci: Normal.  Cranium: Normal.  Paranasal sinuses: Normal.  Mastoid sinuses: Normal.   IMPRESSION:  1. Normal MRI of the orbits.  2. 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 This may be seen in multisystem atrophy, but  other etiologies are possible. Correlate clinically.    Description of dizziness: unsteadiness and whoozy Frequency: Multiple times throughout the day Duration: dizziness a few seconds but constant unsteadiness Symptom nature: intermittent, spontaneous, and positional Progression of symptoms since onset: worse, increasing frequency History of similar episodes: No  Provocative Factors: bending forward, sit to stand, turning quickly, no problems with laying down or rolling in bed Easing Factors:  waiting for symptoms to pass.  Auditory complaints (tinnitus, pain, drainage, hearing loss, aural fullness): Yes, pt reports hearing loss and occasional aural fullness but otherwise no changes; Vision changes (diplopia, visual field loss, recent changes, recent eye exam): Yes, chronic double vision intermittently when fatigued (in the last couple months) Chest pain/palpitations: No History of head injury/concussion: No Stress/anxiety: No Numbness/tingling: Denies Focal weakness: Denies but  reports progressive weakness in grip strength; Headaches/migraines: Denies  Has patient fallen in last 6 months? Yes, Number of falls: 1 Pertinent pain: No Dominant hand: right Imaging: Yes  Prior level of function: Independent for ADLs/IADLs Occupational demands: Not working Hobbies: Pt has animals on her property for which she cares  Red Flags: Positive: occasional dysphagia, weight loss (approximately 40# over the last 2-3 years, poor appetite and partially accounted by intentional weight loss), Denies: dysarthria, bowel and bladder changes, chills, fever, or night sweats;  PRECAUTIONS: None  WEIGHT BEARING RESTRICTIONS No  LIVING ENVIRONMENT: Lives with: lives with their spouse Lives in: House/apartment, 2 stories Stairs: Yes; 12-15 stairs inside railing on L during ascend Has following equipment at home: None  PATIENT GOALS: Improve balance, pt wants to be able to walk to the mailbox (gravel driveway about 0.5 miles);   OBJECTIVE  EXAMINATION  POSTURE: No gross deficits contributing to symptoms  NEUROLOGICAL SCREEN: (2+ unless otherwise noted.) N=normal  Ab=abnormal  Level Dermatome R L Myotome R L Reflex R L  C3 Anterior Neck N N Sidebend C2-3 N N Jaw CN V    C4 Top of Shoulder N N Shoulder Shrug C4 N N Hoffman's UMN    C5 Lateral Upper Arm N N Shoulder ABD C4-5 N N Biceps C5-6    C6 Lateral Arm/ Thumb N N Arm Flex/ Wrist Ext C5-6 N N Brachiorad. C5-6    C7 Middle Finger N N Arm Ext//Wrist Flex C6-7 N N Triceps  C7    C8 4th & 5th Finger N N Flex/ Ext Carpi Ulnaris C8 N N Patellar (L3-4)    T1 Medial Arm N N Interossei T1 N N Gastrocnemius    L2 Medial thigh/groin N N Illiopsoas (L2-3) N N     L3 Lower thigh/med.knee N N Quadriceps (L3-4) N N     L4 Medial leg/lat thigh N N Tibialis Ant (L4-5) N N     L5 Lat. leg & dorsal foot N N EHL (L5) N N     S1 post/lat foot/thigh/leg N N Gastrocnemius (S1-2) N N     S2 Post./med. thigh & leg N N Hamstrings (L4-S3) N N        CRANIAL NERVES II, III, IV, VI: Pupils equal and reactive to light, visual acuity and visual fields are intact, extraocular muscles are intact  V: Facial sensation is intact and symmetric bilaterally  VII: Facial strength is intact and symmetric bilaterally  VIII: Hearing is normal as tested by gross conversation IX, X: Palate elevates midline, normal phonation, uvula midline XI: Shoulder shrug strength is intact  XII: Tongue protrudes midline    SOMATOSENSORY Grossly intact to light touch bilateral UEs/LEs as determined by testing dermatomes C2-T2 and L2-S2. Proprioception and hot/cold testing deferred on this date.   COORDINATION Finger to Nose: Dysmetria bilaterally Heel to Shin: Normal Pronator Drift: Positive on the right Rapid Alternating Movements: Normal Finger to Thumb Opposition: Normal Ataxic gait noted with wide base of support and pt leaning slightly backwards    RANGE OF MOTION Cervical Spine AROM WFL and painless in all planes. No focal deficits in AROM noted in BUE/BLE   MANUAL MUSCLE TESTING BUE/BLE strength WNL without focal deficits with the exception of 4/5 bilateral hip flexion, 4/5 bilateral ankle dorsiflexion, and decreased bilateral grip strength;   TRANSFERS/GAIT Independent for transfers and ambulation without assistive device    PATIENT SURVEYS FOTO: 50, predicted improvement to 58 ABC: To be completed, 02/17/22: 48.1%   POSTURAL CONTROL TESTS  Clinical Test of Sensory Interaction for Balance (CTSIB): Deferred  OCULOMOTOR / VESTIBULAR TESTING  Oculomotor Exam- Room Light  Findings Comments  Ocular Alignment normal   Ocular ROM normal   Spontaneous Nystagmus normal   Gaze-Holding Nystagmus normal   End-Gaze Nystagmus normal   Vergence (normal 2-3") not examined   Smooth Pursuit abnormal Saccadic  Cross-Cover Test not examined   Saccades abnormal Slow and consistently hypometric  VOR Cancellation abnormal Consistent saccades   Left Head Impulse abnormal Corrective saccade required  Right Head Impulse normal   Static Acuity not examined   Dynamic Acuity not examined     Oculomotor Exam- Fixation Suppressed: Deferred   BPPV TESTS:  Symptoms Duration Intensity Nystagmus  L Dix-Hallpike None   Downbeating (does not fatigue)  R Dix-Hallpike Oscillopsia 10-15s  Downbeating (does not fatigue)  L Head Roll None   None  R Head Roll None   None  L Sidelying Test      R Sidelying Test      (blank = not tested)   Modified Clinical Test of Sensory Interaction for Balance (mCTSIB), 02/17/22:  CONDITION TIME SWAY  Eyes open, firm surface 30 seconds 2+  Eyes closed, firm surface 30 seconds 3+  Eyes open, foam surface 30 seconds 3+  Eyes closed, foam surface 6 seconds 4+    Oculomotor Exam- Fixation Suppressed (02/17/22)  Findings Comments  Ocular Alignment normal   Spontaneous Nystagmus abnormal R horizontal beating nystagmus  Gaze-Holding  Nystagmus abnormal Worsening R horizontal beating nystagmus with R gaze, Downbeating nystagmus with L gaze  End-Gaze Nystagmus abnormal See above  Head Shaking Nystagmus No change   Pressure-Induced Nystagmus No change   Hyperventilation Induced Nystagmus not examined   Skull Vibration Induced Nystagmus not examined     FUNCTIONAL OUTCOME MEASURES   02/17/22 02/21/22 Comments  BERG 51/56  Mild balance deficit  DGI     FGA 17/30  Severe dynamic instability  TUG     5TSTS 8.9s  WNL  6 Minute Walk Test     10 Meter Gait Speed     MOCA  24/30   (blank = not tested)   TODAY'S TREATMENT   SUBJECTIVE: Pt states that she is doing well today. She continues with dizziness and imbalance. She scheduled with neurology on 05/08/22 and is on a waiting list for a cancellation if something opens sooner. No specific questions or concerns currently.     PAIN: Denies   Neuromuscular Re-education  All balance exercises performed in // bars without UE support unless otherwise  specified: NuStep L1-2 x 5 minutes for warm-up during interval history;  DVA: Static: 20/25 (line 7) Dynamic: 20/70 (line 3)  Feet together eyes open/closed x 30s each; Feet together horizontal and vertical head turns x 30s each; 6" alternating toe taps x 15 BLE; Heel/toe raises x 10 each direction; Tandem balance alternating forward LE x 30s each; Tandem balance alternating forward LE with horizontal and vertical head turns x 30s each; Tandem gait 15' x 4; VOR x 1 horizontal in standing with feet apart and target at arms length on plain wall 3 x 60s with increase in dizziness after each rep;   Ther-ex  Sit to stand without UE support x 10, holding 4# med ball x 10; Squats with glute chair taps 2 x 10;   Not Performed: Airex 6" alternating toe taps x 15 BLE; Airex feet together eyes open/closed x 30s each; Airex feet together horizontal and vertical head turns x 30s each; Single leg balance x 30s on each LE;    PATIENT EDUCATION:  Education details: Balance exercises, Pt educated throughout session about proper posture and technique with exercises. Improved exercise technique, movement at target joints, use of target muscles after min to mod verbal, visual, tactile cues.  Person educated: Patient Education method: Explanation  Education comprehension: verbalized understanding    HOME EXERCISE PROGRAM: Access Code: [email or web address] URL: [email or web address] Date: 02/21/2022 Prepared by: Roxana Hires  Exercises - Standing Tandem Balance with Counter Support  - 2 x daily - 7 x weekly - 3 x 30s with each foot forward hold - Standing Single Leg Stance with Counter Support  - 2 x daily - 7 x weekly - 3 x 30s on each foot hold   ASSESSMENT:  CLINICAL IMPRESSION: Continued with adaptation, habituation, and balance exercises during session today. Positive DVA Test today with pt experiencing a 4 line loss. Pt continues to report dizziness during VOR x 1 horizontal.  Added strengthening exercises into session today. No HEP modifications on this date. Pt encouraged to follow-up as scheduled. Patient will benefit from skilled PT to address above impairments and improve overall function.   REHAB POTENTIAL: Fair    CLINICAL DECISION MAKING: Evolving/moderate complexity  EVALUATION COMPLEXITY: Moderate   GOALS: Goals reviewed with patient? No  SHORT TERM GOALS: Target date: 03/15/2022  Pt will be independent with HEP for dizziness in order to decrease symptoms, improve balance,decrease fall risk, and improve function  at home. Baseline: Goal status: INITIAL   LONG TERM GOALS: Target date: 04/12/2022  Pt will increase FOTO to at least 58 to demonstrate significant improvement in function at home related to dizziness.  Baseline: 02/15/22: 50 Goal status: INITIAL  2.  Pt will improve FGA by at least 4 points in order to demonstrate clinically significant improvement in balance and decreased risk for falls.     Baseline: 02/15/22: To be completed; 02/17/22: 17/30 Goal status: Revised  3.  Pt will improve ABC by at least 13% in order to demonstrate clinically significant improvement in balance confidence.      Baseline: 02/15/22: To be completed; 02/17/22: 48.1% Goal status: INITIAL  4. Pt will improve BERG by at least 3 points in order to demonstrate clinically significant improvement in balance and decreased risk for falls.     Baseline: 02/15/22: To be completed, 02/17/22: 51/56 Goal status: INITIAL   PLAN:  PT FREQUENCY: 1-2x/week  PT DURATION: 8 weeks  PLANNED INTERVENTIONS: Therapeutic exercises, Therapeutic activity, Neuromuscular re-education, Balance training, Gait training, Patient/Family education, Joint manipulation, Joint mobilization, Canalith repositioning, Aquatic Therapy, Dry Needling, Cognitive remediation, Electrical stimulation, Spinal manipulation, Spinal mobilization, Cryotherapy, Moist heat, Traction, Ultrasound, Ionotophoresis '4mg'$ /ml  Dexamethasone, and Manual therapy  PLAN FOR NEXT SESSION:  continue adaptation, habituation, and balance exercises with patient;   Phillips Grout PT, DPT, GCS  Julie Hunter 02/23/2022, 6:08 PM

## 2022-02-23 ENCOUNTER — Ambulatory Visit: Payer: Medicare Other

## 2022-02-23 DIAGNOSIS — R2681 Unsteadiness on feet: Secondary | ICD-10-CM

## 2022-02-23 DIAGNOSIS — R42 Dizziness and giddiness: Secondary | ICD-10-CM | POA: Diagnosis not present

## 2022-02-28 ENCOUNTER — Ambulatory Visit: Payer: Medicare Other

## 2022-02-28 DIAGNOSIS — R2681 Unsteadiness on feet: Secondary | ICD-10-CM

## 2022-02-28 DIAGNOSIS — R42 Dizziness and giddiness: Secondary | ICD-10-CM | POA: Diagnosis not present

## 2022-02-28 NOTE — Therapy (Signed)
OUTPATIENT PHYSICAL THERAPY VESTIBULAR TREATMENT   Patient Name: Julie Hunter MRN: [id number] DOB:04/30/1956, 66 y.o., female Today's Date: 02/28/2022  PCP: Patient, No Pcp Per REFERRING PROVIDER: Molli Knock, MD   PT End of Session - 02/28/22 1447     Visit Number 5    Number of Visits 17    Date for PT Re-Evaluation 04/12/22    Authorization Type eval: 02/15/22    PT Start Time 1446    PT Stop Time 1530    PT Time Calculation (min) 44 min    Activity Tolerance Patient tolerated treatment well    Behavior During Therapy WFL for tasks assessed/performed              Past Medical History:  Diagnosis Date   Glaucoma    Past Surgical History:  Procedure Laterality Date   ROTATOR CUFF REPAIR Right    SHOULDER SURGERY Left    There are no problems to display for this patient.    PCP: Patient, No Pcp Per  REFERRING PROVIDER: Molli Knock, MD  REFERRING DIAGNOSIS: R42 (ICD-10-CM) - Dizziness and giddiness  THERAPY DIAG: Unsteadiness on feet  Dizziness and giddiness  RATIONALE FOR EVALUATION AND TREATMENT: Rehabilitation  ONSET DATE: 03/10/2021 (approximate)  FOLLOW UP APPT WITH PROVIDER: No    SUBJECTIVE:   Chief Complaint:  Dizziness and unsteadiness  Pertinent History Pt referred secondary to complaints of occasional dizzy spells as well as imbalance. Symptoms first started sometime before last October and accelerated between her first and second COVID vaccination in the fall. She has noted progressive worsening of her balance as well as progressive difficulty with her short term memory. Pt now tries to hold her husbands arm when walking to facilitate balance and states that if she is holding his arm she is stable. Symptoms never occur when laying down, rolling in bed, or looking up. She denies any true vertigo. 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 Per ENT note pt had a blocked artery several years ago and was  placed on baby aspirin. She was also found to have bilateral sensorineural hearing loss and they are recommending hearing aids. Pt started noticing a blurry spot in her left eye in 2014 and while she was undergoing work-up by optho she had an MRI done which demonstrated brian atrophy, in particular the posterior fossa. She was referred to neurology and at that time she denied ever noticing any memory problems or gait difficulties. Per neurology note pt denied any family history of similar symptoms although mother did have dementia in late-life and father had what she thinks was PSP. She was followed by [facility] from 10/17/16 through 04/01/19. 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 Pt denies having seen neurology since 2020. According to the last neurology note there were initially concerns about a neurocognitive or neurodegenerative process. However, she has followed up for 2.5 years and she developed no abnormalities on her examination. 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 She was advised to follow up if she noticed any gait changes, falls, or more objective cognitive difficulties.   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   COMPARISON: None   TECHNIQUE/PROTOCOL: Standard orbits pre and post contrast protocol MRI  performed, including additional imaging pre and post contrast of the brain.   CONTRAST: 44m MultiHance IV. This MRI was performed before and after IV  administration of contrast material. IV contrast was administered to  improve  disease detection and further define anatomy.  *    Complications:  No immediate patient complications or events noted.   FINDINGS:  Globes: Normal.  Lenses: Normal appearing native lenses.  Optic nerves: Normal.  Intraconal fat: Normal.  Intraocular hemorrhage: Normal.  Extraocular muscles: Normal.  Intraorbital vasculature: Normal.  Lacrimal glands: Normal.   Cerebrum: No mass, mass effect, acute infarct or hemorrhage. Markedly  advanced for age global cerebral atrophy, which is disproportionately worse  within the pons and medulla.  Intracranial flow voids: Normal.  Extra-axial spaces, basal cisterns, ventricles and sulci: Normal.  Cranium: Normal.  Paranasal sinuses: Normal.  Mastoid sinuses: Normal.   IMPRESSION:  1. Normal MRI of the orbits.  2. 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 This may be seen in multisystem atrophy, but  other etiologies are possible. Correlate clinically.    Description of dizziness: unsteadiness and whoozy Frequency: Multiple times throughout the day Duration: dizziness a few seconds but constant unsteadiness Symptom nature: intermittent, spontaneous, and positional Progression of symptoms since onset: worse, increasing frequency History of similar episodes: No  Provocative Factors: bending forward, sit to stand, turning quickly, no problems with laying down or rolling in bed Easing Factors:  waiting for symptoms to pass.  Auditory complaints (tinnitus, pain, drainage, hearing loss, aural fullness): Yes, pt reports hearing loss and occasional aural fullness but otherwise no changes; Vision changes (diplopia, visual field loss, recent changes, recent eye exam): Yes, chronic double vision intermittently when fatigued (in the last couple months) Chest pain/palpitations: No History of head injury/concussion: No Stress/anxiety: No Numbness/tingling: Denies Focal weakness: Denies but  reports progressive weakness in grip strength; Headaches/migraines: Denies  Has patient fallen in last 6 months? Yes, Number of falls: 1 Pertinent pain: No Dominant hand: right Imaging: Yes  Prior level of function: Independent for ADLs/IADLs Occupational demands: Not working Hobbies: Pt has animals on her property for which she cares  Red Flags: Positive: occasional dysphagia, weight loss (approximately 40# over the last 2-3 years, poor appetite and partially accounted by intentional weight loss), Denies: dysarthria, bowel and bladder changes, chills, fever, or night sweats;  PRECAUTIONS: None  WEIGHT BEARING RESTRICTIONS No  LIVING ENVIRONMENT: Lives with: lives with their spouse Lives in: House/apartment, 2 stories Stairs: Yes; 12-15 stairs inside railing on L during ascend Has following equipment at home: None  PATIENT GOALS: Improve balance, pt wants to be able to walk to the mailbox (gravel driveway about 0.5 miles);   OBJECTIVE  EXAMINATION  POSTURE: No gross deficits contributing to symptoms  NEUROLOGICAL SCREEN: (2+ unless otherwise noted.) N=normal  Ab=abnormal  Level Dermatome R L Myotome R L Reflex R L  C3 Anterior Neck N N Sidebend C2-3 N N Jaw CN V    C4 Top of Shoulder N N Shoulder Shrug C4 N N Hoffman's UMN    C5 Lateral Upper Arm N N Shoulder ABD C4-5 N N Biceps C5-6    C6 Lateral Arm/ Thumb N N Arm Flex/ Wrist Ext C5-6 N N Brachiorad. C5-6    C7 Middle Finger N N Arm Ext//Wrist Flex C6-7 N N Triceps  C7    C8 4th & 5th Finger N N Flex/ Ext Carpi Ulnaris C8 N N Patellar (L3-4)    T1 Medial Arm N N Interossei T1 N N Gastrocnemius    L2 Medial thigh/groin N N Illiopsoas (L2-3) N N     L3 Lower thigh/med.knee N N Quadriceps (L3-4) N N     L4 Medial leg/lat thigh N N Tibialis Ant (L4-5) N N     L5 Lat. leg & dorsal foot N N EHL (L5) N N     S1 post/lat foot/thigh/leg N N Gastrocnemius (S1-2) N N     S2 Post./med. thigh & leg N N Hamstrings (L4-S3) N N        CRANIAL NERVES II, III, IV, VI: Pupils equal and reactive to light, visual acuity and visual fields are intact, extraocular muscles are intact  V: Facial sensation is intact and symmetric bilaterally  VII: Facial strength is intact and symmetric bilaterally  VIII: Hearing is normal as tested by gross conversation IX, X: Palate elevates midline, normal phonation, uvula midline XI: Shoulder shrug strength is intact  XII: Tongue protrudes midline    SOMATOSENSORY Grossly intact to light touch bilateral UEs/LEs as determined by testing dermatomes C2-T2 and L2-S2. Proprioception and hot/cold testing deferred on this date.   COORDINATION Finger to Nose: Dysmetria bilaterally Heel to Shin: Normal Pronator Drift: Positive on the right Rapid Alternating Movements: Normal Finger to Thumb Opposition: Normal Ataxic gait noted with wide base of support and pt leaning slightly backwards    RANGE OF MOTION Cervical Spine AROM WFL and painless in all planes. No focal deficits in AROM noted in BUE/BLE   MANUAL MUSCLE TESTING BUE/BLE strength WNL without focal deficits with the exception of 4/5 bilateral hip flexion, 4/5 bilateral ankle dorsiflexion, and decreased bilateral grip strength;   TRANSFERS/GAIT Independent for transfers and ambulation without assistive device    PATIENT SURVEYS FOTO: 50, predicted improvement to 58 ABC: To be completed, 02/17/22: 48.1%   POSTURAL CONTROL TESTS  Clinical Test of Sensory Interaction for Balance (CTSIB): Deferred  OCULOMOTOR / VESTIBULAR TESTING  Oculomotor Exam- Room Light  Findings Comments  Ocular Alignment normal   Ocular ROM normal   Spontaneous Nystagmus normal   Gaze-Holding Nystagmus normal   End-Gaze Nystagmus normal   Vergence (normal 2-3") not examined   Smooth Pursuit abnormal Saccadic  Cross-Cover Test not examined   Saccades abnormal Slow and consistently hypometric  VOR Cancellation abnormal Consistent saccades   Left Head Impulse abnormal Corrective saccade required  Right Head Impulse normal   Static Acuity normal (02/23/22) 20/25 (line 7)  Dynamic Acuity abnormal (02/23/22) 20/70 (line 3)    Oculomotor Exam- Fixation Suppressed: Deferred   BPPV TESTS:  Symptoms Duration Intensity Nystagmus  L Dix-Hallpike None   Downbeating (does not fatigue)  R Dix-Hallpike Oscillopsia 10-15s  Downbeating (does not fatigue)  L Head Roll None   None  R Head Roll None   None  L Sidelying Test      R Sidelying Test      (blank = not tested)   Modified Clinical Test of Sensory Interaction for Balance (mCTSIB), 02/17/22:  CONDITION TIME SWAY  Eyes open, firm surface 30 seconds 2+  Eyes closed, firm surface 30 seconds 3+  Eyes open, foam surface 30 seconds 3+  Eyes closed, foam surface 6 seconds 4+    Oculomotor Exam- Fixation Suppressed (02/17/22)  Findings Comments  Ocular Alignment normal   Spontaneous Nystagmus abnormal R horizontal  beating nystagmus  Gaze-Holding Nystagmus abnormal Worsening R horizontal beating nystagmus with R gaze, Downbeating nystagmus with L gaze  End-Gaze Nystagmus abnormal See above  Head Shaking Nystagmus No change   Pressure-Induced Nystagmus No change   Hyperventilation Induced Nystagmus not examined   Skull Vibration Induced Nystagmus not examined     FUNCTIONAL OUTCOME MEASURES   02/17/22 02/21/22 Comments  BERG 51/56  Mild balance deficit  DGI     FGA 17/30  Severe dynamic instability  TUG     5TSTS 8.9s  WNL  6 Minute Walk Test     10 Meter Gait Speed     MOCA  24/30   (blank = not tested)   TODAY'S TREATMENT   SUBJECTIVE: Pt states that she is doing well today. She continues with dizziness and imbalance. No pain reported upon arrival. No specific questions or concerns currently.     PAIN: Denies   Neuromuscular Re-education  NuStep L1-3 x 6 minutes for warm-up during interval history (3 minutes unbilled);  All balance exercises performed in //  bars without UE support unless otherwise specified: VOR x 1 horizontal in standing with feet together and target at arms length on plain wall 3 x 60s with increase in dizziness after each rep; Tandem balance alternating forward LE x 30s each; Tandem balance alternating forward LE with horizontal and vertical head turns x 30s each; Tandem gait forward/backward 15' x 4; Airex feet together eyes open/closed x 30s each; Airex feet together horizontal and vertical head turns x 30s each; Airex 6" alternating cone taps x 15 BLE; Heel/toe raises x 10 each direction; Gait in hallway with vertical ball lifts with head/eye follow 2 x 70'; Gait in hallway with horizontal ball passes between hands with head/eye follow 4 x 70';   Not Performed: Single leg balance x 30s on each LE; Sit to stand without UE support x 10, holding 4# med ball x 10; Squats with glute chair taps 2 x 10;    PATIENT EDUCATION:  Education details: Balance exercises, Pt educated throughout session about proper posture and technique with exercises. Improved exercise technique, movement at target joints, use of target muscles after min to mod verbal, visual, tactile cues.  Person educated: Patient Education method: Explanation  Education comprehension: verbalized understanding    HOME EXERCISE PROGRAM: Access Code: [email or web address] URL: [email or web address] Date: 02/21/2022 Prepared by: Roxana Hires  Exercises - Standing Tandem Balance with Counter Support  - 2 x daily - 7 x weekly - 3 x 30s with each foot forward hold - Standing Single Leg Stance with Counter Support  - 2 x daily - 7 x weekly - 3 x 30s on each foot hold   ASSESSMENT:  CLINICAL IMPRESSION: Continued with adaptation, habituation, and balance exercises during session today. Pt continues to report dizziness during VOR x 1 horizontal. Introduced head turn/ball passes during gait in a hallway which proves very difficult for patient. Horizontal ball  passes are particularly challenging. No HEP modifications on this date. Pt encouraged to follow-up as scheduled. Patient will benefit from skilled PT to address above impairments and improve overall function.   REHAB POTENTIAL: Fair    CLINICAL DECISION MAKING: Evolving/moderate complexity  EVALUATION COMPLEXITY: Moderate   GOALS: Goals reviewed with patient? No  SHORT TERM GOALS: Target date: 03/15/2022  Pt will be independent with HEP for dizziness in order to decrease symptoms, improve balance,decrease fall risk, and improve function at home. Baseline: Goal status: INITIAL   LONG TERM GOALS: Target  date: 04/12/2022  Pt will increase FOTO to at least 58 to demonstrate significant improvement in function at home related to dizziness.  Baseline: 02/15/22: 50 Goal status: INITIAL  2.  Pt will improve FGA by at least 4 points in order to demonstrate clinically significant improvement in balance and decreased risk for falls.     Baseline: 02/15/22: To be completed; 02/17/22: 17/30 Goal status: Revised  3.  Pt will improve ABC by at least 13% in order to demonstrate clinically significant improvement in balance confidence.      Baseline: 02/15/22: To be completed; 02/17/22: 48.1% Goal status: INITIAL  4. Pt will improve BERG by at least 3 points in order to demonstrate clinically significant improvement in balance and decreased risk for falls.     Baseline: 02/15/22: To be completed, 02/17/22: 51/56 Goal status: INITIAL   PLAN:  PT FREQUENCY: 1-2x/week  PT DURATION: 8 weeks  PLANNED INTERVENTIONS: Therapeutic exercises, Therapeutic activity, Neuromuscular re-education, Balance training, Gait training, Patient/Family education, Joint manipulation, Joint mobilization, Canalith repositioning, Aquatic Therapy, Dry Needling, Cognitive remediation, Electrical stimulation, Spinal manipulation, Spinal mobilization, Cryotherapy, Moist heat, Traction, Ultrasound, Ionotophoresis '4mg'$ /ml Dexamethasone,  and Manual therapy  PLAN FOR NEXT SESSION:  continue adaptation, habituation, and balance exercises with patient;   Phillips Grout PT, DPT, GCS  Jaymian Bogart 02/28/2022, 4:52 PM

## 2022-03-02 ENCOUNTER — Ambulatory Visit: Payer: Medicare Other

## 2022-03-02 DIAGNOSIS — R2681 Unsteadiness on feet: Secondary | ICD-10-CM

## 2022-03-02 DIAGNOSIS — R42 Dizziness and giddiness: Secondary | ICD-10-CM

## 2022-03-02 NOTE — Therapy (Signed)
OUTPATIENT PHYSICAL THERAPY VESTIBULAR TREATMENT   Patient Name: Julie Hunter MRN: [id number] DOB:[birth year], 66 y.o., female Today's Date: 03/03/2022  PCP: Patient, No Pcp Per REFERRING PROVIDER: Molli Knock, MD   PT End of Session - 03/02/22 1622     Visit Number 6    Number of Visits 17    Date for PT Re-Evaluation 04/12/22    Authorization Type eval: 02/15/22    PT Start Time 1617    PT Stop Time 1700    PT Time Calculation (min) 43 min    Activity Tolerance Patient tolerated treatment well    Behavior During Therapy WFL for tasks assessed/performed             Past Medical History:  Diagnosis Date   Glaucoma    Past Surgical History:  Procedure Laterality Date   ROTATOR CUFF REPAIR Right    SHOULDER SURGERY Left    There are no problems to display for this patient.   PCP: Patient, No Pcp Per  REFERRING PROVIDER: Molli Knock, MD  REFERRING DIAGNOSIS: R42 (ICD-10-CM) - Dizziness and giddiness  THERAPY DIAG: Unsteadiness on feet  Dizziness and giddiness  RATIONALE FOR EVALUATION AND TREATMENT: Rehabilitation  ONSET DATE: 03/10/2021 (approximate)  FOLLOW UP APPT WITH PROVIDER: No    SUBJECTIVE:   Chief Complaint:  Dizziness and unsteadiness  Pertinent History Pt referred secondary to complaints of occasional dizzy spells as well as imbalance. Symptoms first started sometime before last October and accelerated between her first and second COVID vaccination in the fall. She has noted progressive worsening of her balance as well as progressive difficulty with her short term memory. Pt now tries to hold her husbands arm when walking to facilitate balance and states that if she is holding his arm she is stable. Symptoms never occur when laying down, rolling in bed, or looking up. She denies any true vertigo. 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 Per ENT note pt had a blocked artery several years ago and was placed on  baby aspirin. She was also found to have bilateral sensorineural hearing loss and they are recommending hearing aids. Pt started noticing a blurry spot in her left eye in 2014 and while she was undergoing work-up by optho she had an MRI done which demonstrated brian atrophy, in particular the posterior fossa. She was referred to neurology and at that time she denied ever noticing any memory problems or gait difficulties. Per neurology note pt denied any family history of similar symptoms although mother did have dementia in late-life and father had what she thinks was PSP. She was followed by [place] neurology from 10/17/16 through 04/01/19. 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 Pt denies having seen neurology since 2020. According to the last neurology note there were initially concerns about a neurocognitive or neurodegenerative process. However, she has followed up for 2.5 years and she developed no abnormalities on her examination. 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 She was advised to follow up if she noticed any gait changes, falls, or more objective cognitive difficulties.   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   COMPARISON: None   TECHNIQUE/PROTOCOL: Standard orbits pre and post contrast protocol MRI  performed, including additional imaging pre and post contrast of the brain.   CONTRAST: 72m MultiHance IV. This MRI was performed before and after IV  administration of contrast material. IV contrast was administered to  improve disease  detection and further define anatomy.  *    Complications:  No immediate patient complications or events noted.   FINDINGS:  Globes: Normal.  Lenses: Normal appearing native lenses.  Optic nerves: Normal.  Intraconal fat: Normal.  Intraocular hemorrhage: Normal.  Extraocular muscles: Normal.  Intraorbital vasculature: Normal.  Lacrimal glands: Normal.   Cerebrum: No mass, mass effect, acute infarct or hemorrhage. Markedly  advanced for age global cerebral atrophy, which is disproportionately worse  within the pons and medulla.  Intracranial flow voids: Normal.  Extra-axial spaces, basal cisterns, ventricles and sulci: Normal.  Cranium: Normal.  Paranasal sinuses: Normal.  Mastoid sinuses: Normal.   IMPRESSION:  1. Normal MRI of the orbits.  2. 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 This may be seen in multisystem atrophy, but  other etiologies are possible. Correlate clinically.    Description of dizziness: unsteadiness and whoozy Frequency: Multiple times throughout the day Duration: dizziness a few seconds but constant unsteadiness Symptom nature: intermittent, spontaneous, and positional Progression of symptoms since onset: worse, increasing frequency History of similar episodes: No  Provocative Factors: bending forward, sit to stand, turning quickly, no problems with laying down or rolling in bed Easing Factors:  waiting for symptoms to pass.  Auditory complaints (tinnitus, pain, drainage, hearing loss, aural fullness): Yes, pt reports hearing loss and occasional aural fullness but otherwise no changes; Vision changes (diplopia, visual field loss, recent changes, recent eye exam): Yes, chronic double vision intermittently when fatigued (in the last couple months) Chest pain/palpitations: No History of head injury/concussion: No Stress/anxiety: No Numbness/tingling: Denies Focal weakness: Denies but reports  progressive weakness in grip strength; Headaches/migraines: Denies  Has patient fallen in last 6 months? Yes, Number of falls: 1 Pertinent pain: No Dominant hand: right Imaging: Yes  Prior level of function: Independent for ADLs/IADLs Occupational demands: Not working Hobbies: Pt has animals on her property for which she cares  Red Flags: Positive: occasional dysphagia, weight loss (approximately 40# over the last 2-3 years, poor appetite and partially accounted by intentional weight loss), Denies: dysarthria, bowel and bladder changes, chills, fever, or night sweats;  PRECAUTIONS: None  WEIGHT BEARING RESTRICTIONS No  LIVING ENVIRONMENT: Lives with: lives with their spouse Lives in: House/apartment, 2 stories Stairs: Yes; 12-15 stairs inside railing on L during ascend Has following equipment at home: None  PATIENT GOALS: Improve balance, pt wants to be able to walk to the mailbox (gravel driveway about 0.5 miles);   OBJECTIVE  EXAMINATION  POSTURE: No gross deficits contributing to symptoms  NEUROLOGICAL SCREEN: (2+ unless otherwise noted.) N=normal  Ab=abnormal  Level Dermatome R L Myotome R L Reflex R L  C3 Anterior Neck N N Sidebend C2-3 N N Jaw CN V    C4 Top of Shoulder N N Shoulder Shrug C4 N N Hoffman's UMN    C5 Lateral Upper Arm N N Shoulder ABD C4-5 N N Biceps C5-6    C6 Lateral Arm/ Thumb N N Arm Flex/ Wrist Ext C5-6 N N Brachiorad. C5-6    C7 Middle Finger N N Arm Ext//Wrist Flex C6-7 N N Triceps C7  C8 4th & 5th Finger N N Flex/ Ext Carpi Ulnaris C8 N N Patellar (L3-4)    T1 Medial Arm N N Interossei T1 N N Gastrocnemius    L2 Medial thigh/groin N N Illiopsoas (L2-3) N N     L3 Lower thigh/med.knee N N Quadriceps (L3-4) N N     L4 Medial leg/lat thigh N N Tibialis Ant (L4-5) N N     L5 Lat. leg & dorsal foot N N EHL (L5) N N     S1 post/lat foot/thigh/leg N N Gastrocnemius (S1-2) N N     S2 Post./med. thigh & leg N N Hamstrings (L4-S3) N N        CRANIAL NERVES II, III, IV, VI: Pupils equal and reactive to light, visual acuity and visual fields are intact, extraocular muscles are intact  V: Facial sensation is intact and symmetric bilaterally  VII: Facial strength is intact and symmetric bilaterally  VIII: Hearing is normal as tested by gross conversation IX, X: Palate elevates midline, normal phonation, uvula midline XI: Shoulder shrug strength is intact  XII: Tongue protrudes midline    SOMATOSENSORY Grossly intact to light touch bilateral UEs/LEs as determined by testing dermatomes C2-T2 and L2-S2. Proprioception and hot/cold testing deferred on this date.   COORDINATION Finger to Nose: Dysmetria bilaterally Heel to Shin: Normal Pronator Drift: Positive on the right Rapid Alternating Movements: Normal Finger to Thumb Opposition: Normal Ataxic gait noted with wide base of support and pt leaning slightly backwards    RANGE OF MOTION Cervical Spine AROM WFL and painless in all planes. No focal deficits in AROM noted in BUE/BLE   MANUAL MUSCLE TESTING BUE/BLE strength WNL without focal deficits with the exception of 4/5 bilateral hip flexion, 4/5 bilateral ankle dorsiflexion, and decreased bilateral grip strength;   TRANSFERS/GAIT Independent for transfers and ambulation without assistive device    PATIENT SURVEYS FOTO: 50, predicted improvement to 58 ABC: To be completed, 02/17/22: 48.1%   POSTURAL CONTROL TESTS  Clinical Test of Sensory Interaction for Balance (CTSIB): Deferred  OCULOMOTOR / VESTIBULAR TESTING  Oculomotor Exam- Room Light  Findings Comments  Ocular Alignment normal   Ocular ROM normal   Spontaneous Nystagmus normal   Gaze-Holding Nystagmus normal   End-Gaze Nystagmus normal   Vergence (normal 2-3") not examined   Smooth Pursuit abnormal Saccadic  Cross-Cover Test not examined   Saccades abnormal Slow and consistently hypometric  VOR Cancellation abnormal Consistent saccades   Left Head Impulse abnormal Corrective saccade required  Right Head Impulse normal   Static Acuity normal (02/23/22) 20/25 (line 7)  Dynamic Acuity abnormal (02/23/22) 20/70 (line 3)    Oculomotor Exam- Fixation Suppressed: Deferred   BPPV TESTS:  Symptoms Duration Intensity Nystagmus  L Dix-Hallpike None   Downbeating (does not fatigue)  R Dix-Hallpike Oscillopsia 10-15s  Downbeating (does not fatigue)  L Head Roll None   None  R Head Roll None   None  L Sidelying Test      R Sidelying Test      (blank = not tested)   Modified Clinical Test of Sensory Interaction for Balance (mCTSIB), 02/17/22:  CONDITION TIME SWAY  Eyes open, firm surface 30 seconds 2+  Eyes closed, firm surface 30 seconds 3+  Eyes open, foam surface 30 seconds 3+  Eyes closed, foam surface 6 seconds 4+    Oculomotor Exam- Fixation Suppressed (02/17/22)  Findings Comments  Ocular Alignment normal   Spontaneous Nystagmus abnormal R horizontal beating nystagmus  Gaze-Holding  Nystagmus abnormal Worsening R horizontal beating nystagmus with R gaze, Downbeating nystagmus with L gaze  End-Gaze Nystagmus abnormal See above  Head Shaking Nystagmus No change   Pressure-Induced Nystagmus No change   Hyperventilation Induced Nystagmus not examined   Skull Vibration Induced Nystagmus not examined     FUNCTIONAL OUTCOME MEASURES   02/17/22 02/21/22 Comments  BERG 51/56  Mild balance deficit  DGI     FGA 17/30  Severe dynamic instability  TUG     5TSTS 8.9s  WNL  6 Minute Walk Test     10 Meter Gait Speed     MOCA  24/30   (blank = not tested)   TODAY'S TREATMENT   SUBJECTIVE: Pt states that she is doing well today. She continues with dizziness and imbalance. No pain reported upon arrival. No specific questions or concerns currently.     PAIN: Denies   Neuromuscular Re-education  NuStep L2-4 x 6 minutes for warm-up during interval history (3 minutes unbilled);  All balance exercises performed in //  bars without UE support unless otherwise specified: VOR x 1 horizontal in standing with feet together and target at arms length on plain wall 3 x 60s with increase in dizziness after each rep; Tandem balance alternating forward LE x 30s each; Tandem balance alternating forward LE with horizontal and vertical head turns x 30s each; Tandem gait forward/backward 15' x 4; Airex feet together eyes open/closed x 30s each; Airex feet together horizontal and vertical head turns x 30s each; Forward/retro gait in hallway with vertical ball lifts with head/eye follow 2 x 70' each; Forward gait in hallway with horizontal ball passes between hands with head/eye follow 4 x 70'; Forward gait in hallway with ball passes from patient to therapist with head/eye follow 2 x 70' each direction;   Not Performed: Single leg balance x 30s on each LE; Sit to stand without UE support x 10, holding 4# med ball x 10; Squats with glute chair taps 2 x 10; Airex 6" alternating cone taps x 15 BLE; Heel/toe raises x 10 each direction;    PATIENT EDUCATION:  Education details: Balance exercises, Pt educated throughout session about proper posture and technique with exercises. Improved exercise technique, movement at target joints, use of target muscles after min to mod verbal, visual, tactile cues.  Person educated: Patient Education method: Explanation  Education comprehension: verbalized understanding    HOME EXERCISE PROGRAM: Access Code: [email or web address] URL: [email or web address] Date: 02/21/2022 Prepared by: Roxana Hires  Exercises - Standing Tandem Balance with Counter Support  - 2 x daily - 7 x weekly - 3 x 30s with each foot forward hold - Standing Single Leg Stance with Counter Support  - 2 x daily - 7 x weekly - 3 x 30s on each foot hold   ASSESSMENT:  CLINICAL IMPRESSION: Continued with adaptation, habituation, and balance exercises during session today. Pt continues to report dizziness  during VOR x 1 horizontal. Continued head turn/ball passes during gait in a hallway which remains very difficult for patient. Horizontal ball passes remain particularly challenging but are better than last session. No HEP modifications on this date. Pt encouraged to follow-up as scheduled. Patient will benefit from skilled PT to address above impairments and improve overall function.   REHAB POTENTIAL: Fair    CLINICAL DECISION MAKING: Evolving/moderate complexity  EVALUATION COMPLEXITY: Moderate   GOALS: Goals reviewed with patient? No  SHORT TERM GOALS: Target date: 03/15/2022  Pt will be independent with HEP for dizziness  in order to decrease symptoms, improve balance,decrease fall risk, and improve function at home. Baseline: Goal status: INITIAL   LONG TERM GOALS: Target date: 04/12/2022  Pt will increase FOTO to at least 58 to demonstrate significant improvement in function at home related to dizziness.  Baseline: 02/15/22: 50 Goal status: INITIAL  2.  Pt will improve FGA by at least 4 points in order to demonstrate clinically significant improvement in balance and decreased risk for falls.     Baseline: 02/15/22: To be completed; 02/17/22: 17/30 Goal status: Revised  3.  Pt will improve ABC by at least 13% in order to demonstrate clinically significant improvement in balance confidence.      Baseline: 02/15/22: To be completed; 02/17/22: 48.1% Goal status: INITIAL  4. Pt will improve BERG by at least 3 points in order to demonstrate clinically significant improvement in balance and decreased risk for falls.     Baseline: 02/15/22: To be completed, 02/17/22: 51/56 Goal status: INITIAL   PLAN:  PT FREQUENCY: 1-2x/week  PT DURATION: 8 weeks  PLANNED INTERVENTIONS: Therapeutic exercises, Therapeutic activity, Neuromuscular re-education, Balance training, Gait training, Patient/Family education, Joint manipulation, Joint mobilization, Canalith repositioning, Aquatic Therapy, Dry  Needling, Cognitive remediation, Electrical stimulation, Spinal manipulation, Spinal mobilization, Cryotherapy, Moist heat, Traction, Ultrasound, Ionotophoresis '4mg'$ /ml Dexamethasone, and Manual therapy  PLAN FOR NEXT SESSION:  continue adaptation, habituation, and balance exercises with patient;   Phillips Grout PT, DPT, GCS  Marqueta Pulley 03/03/2022, 1:31 PM

## 2022-03-06 NOTE — Therapy (Unsigned)
OUTPATIENT PHYSICAL THERAPY VESTIBULAR TREATMENT   Patient Name: Julie Hunter MRN: [id number] DOB:[birth year], 66 y.o., female Today's Date: 03/09/2022  PCP: Patient, No Pcp Per REFERRING PROVIDER: Molli Knock, MD   PT End of Session - 03/08/22 1101     Visit Number 7    Number of Visits 17    Date for PT Re-Evaluation 04/12/22    Authorization Type eval: 02/15/22    PT Start Time 1100    PT Stop Time 1145    PT Time Calculation (min) 45 min    Activity Tolerance Patient tolerated treatment well    Behavior During Therapy WFL for tasks assessed/performed             Past Medical History:  Diagnosis Date   Glaucoma    Past Surgical History:  Procedure Laterality Date   ROTATOR CUFF REPAIR Right    SHOULDER SURGERY Left    There are no problems to display for this patient.   PCP: Patient, No Pcp Per  REFERRING PROVIDER: Molli Knock, MD  REFERRING DIAGNOSIS: R42 (ICD-10-CM) - Dizziness and giddiness  THERAPY DIAG: Unsteadiness on feet  RATIONALE FOR EVALUATION AND TREATMENT: Rehabilitation  ONSET DATE: 03/10/2021 (approximate)  FOLLOW UP APPT WITH PROVIDER: No    SUBJECTIVE:   Chief Complaint:  Dizziness and unsteadiness  Pertinent History Pt referred secondary to complaints of occasional dizzy spells as well as imbalance. Symptoms first started sometime before last October and accelerated between her first and second COVID vaccination in the fall. She has noted progressive worsening of her balance as well as progressive difficulty with her short term memory. Pt now tries to hold her husbands arm when walking to facilitate balance and states that if she is holding his arm she is stable. Symptoms never occur when laying down, rolling in bed, or looking up. She denies any true vertigo. 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 Per ENT note pt had a blocked artery several years ago and was placed on baby aspirin. She was also  found to have bilateral sensorineural hearing loss and they are recommending hearing aids. Pt started noticing a blurry spot in her left eye in 2014 and while she was undergoing work-up by optho she had an MRI done which demonstrated brian atrophy, in particular the posterior fossa. She was referred to neurology and at that time she denied ever noticing any memory problems or gait difficulties. Per neurology note pt denied any family history of similar symptoms although mother did have dementia in late-life and father had what she thinks was PSP. She was followed by [facility] from 10/17/16 through 04/01/19. 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 Pt denies having seen neurology since 2020. According to the last neurology note there were initially concerns about a neurocognitive or neurodegenerative process. However, she has followed up for 2.5 years and she developed no abnormalities on her examination. 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 She was advised to follow up if she noticed any gait changes, falls, or more objective cognitive difficulties.   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  COMPARISON: None   TECHNIQUE/PROTOCOL: Standard orbits pre and post contrast protocol MRI  performed, including additional imaging pre and post contrast of the brain.   CONTRAST: 44m MultiHance IV. This MRI was performed before and after IV  administration of contrast material. IV contrast was administered to  improve disease detection and further define  anatomy.  *    Complications:  No immediate patient complications or events noted.   FINDINGS:  Globes: Normal.  Lenses: Normal appearing native lenses.  Optic nerves: Normal.  Intraconal fat: Normal.  Intraocular hemorrhage: Normal.  Extraocular muscles: Normal.  Intraorbital vasculature: Normal.  Lacrimal glands: Normal.   Cerebrum: No mass, mass effect, acute infarct or hemorrhage. Markedly  advanced for age global cerebral atrophy, which is disproportionately worse  within the pons and medulla.  Intracranial flow voids: Normal.  Extra-axial spaces, basal cisterns, ventricles and sulci: Normal.  Cranium: Normal.  Paranasal sinuses: Normal.  Mastoid sinuses: Normal.   IMPRESSION:  1. Normal MRI of the orbits.  2. 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 This may be seen in multisystem atrophy, but  other etiologies are possible. Correlate clinically.    Description of dizziness: unsteadiness and whoozy Frequency: Multiple times throughout the day Duration: dizziness a few seconds but constant unsteadiness Symptom nature: intermittent, spontaneous, and positional Progression of symptoms since onset: worse, increasing frequency History of similar episodes: No  Provocative Factors: bending forward, sit to stand, turning quickly, no problems with laying down or rolling in bed Easing Factors:  waiting for symptoms to pass.  Auditory complaints (tinnitus, pain, drainage, hearing loss, aural fullness): Yes, pt reports hearing loss and occasional aural fullness but otherwise no changes; Vision changes (diplopia, visual field loss, recent changes, recent eye exam): Yes, chronic double vision intermittently when fatigued (in the last couple months) Chest pain/palpitations: No History of head injury/concussion: No Stress/anxiety: No Numbness/tingling: Denies Focal weakness: Denies but reports progressive weakness in grip  strength; Headaches/migraines: Denies  Has patient fallen in last 6 months? Yes, Number of falls: 1 Pertinent pain: No Dominant hand: right Imaging: Yes  Prior level of function: Independent for ADLs/IADLs Occupational demands: Not working Hobbies: Pt has animals on her property for which she cares  Red Flags: Positive: occasional dysphagia, weight loss (approximately 40# over the last 2-3 years, poor appetite and partially accounted by intentional weight loss), Denies: dysarthria, bowel and bladder changes, chills, fever, or night sweats;  PRECAUTIONS: None  WEIGHT BEARING RESTRICTIONS No  LIVING ENVIRONMENT: Lives with: lives with their spouse Lives in: House/apartment, 2 stories Stairs: Yes; 12-15 stairs inside railing on L during ascend Has following equipment at home: None  PATIENT GOALS: Improve balance, pt wants to be able to walk to the mailbox (gravel driveway about 0.5 miles);   OBJECTIVE  EXAMINATION  POSTURE: No gross deficits contributing to symptoms  NEUROLOGICAL SCREEN: (2+ unless otherwise noted.) N=normal  Ab=abnormal  Level Dermatome R L Myotome R L Reflex R L  C3 Anterior Neck N N Sidebend C2-3 N N Jaw CN V    C4 Top of Shoulder N N Shoulder Shrug C4 N N Hoffman's UMN    C5 Lateral Upper Arm N N Shoulder ABD C4-5 N N Biceps C5-6    C6 Lateral Arm/ Thumb N N Arm Flex/ Wrist Ext C5-6 N N Brachiorad. C5-6    C7 Middle Finger N N Arm Ext//Wrist Flex C6-7 N N Triceps C7    C8 4th & 5th  Finger N N Flex/ Ext Carpi Ulnaris C8 N N Patellar (L3-4)    T1 Medial Arm N N Interossei T1 N N Gastrocnemius    L2 Medial thigh/groin N N Illiopsoas (L2-3) N N     L3 Lower thigh/med.knee N N Quadriceps (L3-4) N N     L4 Medial leg/lat thigh N N Tibialis Ant (L4-5) N N     L5 Lat. leg & dorsal foot N N EHL (L5) N N     S1 post/lat foot/thigh/leg N N Gastrocnemius (S1-2) N N     S2 Post./med. thigh & leg N N Hamstrings (L4-S3) N N       CRANIAL NERVES II, III, IV, VI:  Pupils equal and reactive to light, visual acuity and visual fields are intact, extraocular muscles are intact  V: Facial sensation is intact and symmetric bilaterally  VII: Facial strength is intact and symmetric bilaterally  VIII: Hearing is normal as tested by gross conversation IX, X: Palate elevates midline, normal phonation, uvula midline XI: Shoulder shrug strength is intact  XII: Tongue protrudes midline    SOMATOSENSORY Grossly intact to light touch bilateral UEs/LEs as determined by testing dermatomes C2-T2 and L2-S2. Proprioception and hot/cold testing deferred on this date.   COORDINATION Finger to Nose: Dysmetria bilaterally Heel to Shin: Normal Pronator Drift: Positive on the right Rapid Alternating Movements: Normal Finger to Thumb Opposition: Normal Ataxic gait noted with wide base of support and pt leaning slightly backwards    RANGE OF MOTION Cervical Spine AROM WFL and painless in all planes. No focal deficits in AROM noted in BUE/BLE   MANUAL MUSCLE TESTING BUE/BLE strength WNL without focal deficits with the exception of 4/5 bilateral hip flexion, 4/5 bilateral ankle dorsiflexion, and decreased bilateral grip strength;   TRANSFERS/GAIT Independent for transfers and ambulation without assistive device    PATIENT SURVEYS FOTO: 50, predicted improvement to 58 ABC: To be completed, 02/17/22: 48.1%   POSTURAL CONTROL TESTS  Clinical Test of Sensory Interaction for Balance (CTSIB): Deferred  OCULOMOTOR / VESTIBULAR TESTING  Oculomotor Exam- Room Light  Findings Comments  Ocular Alignment normal   Ocular ROM normal   Spontaneous Nystagmus normal   Gaze-Holding Nystagmus normal   End-Gaze Nystagmus normal   Vergence (normal 2-3") not examined   Smooth Pursuit abnormal Saccadic  Cross-Cover Test not examined   Saccades abnormal Slow and consistently hypometric  VOR Cancellation abnormal Consistent saccades  Left Head Impulse abnormal Corrective  saccade required  Right Head Impulse normal   Static Acuity normal (02/23/22) 20/25 (line 7)  Dynamic Acuity abnormal (02/23/22) 20/70 (line 3)    Oculomotor Exam- Fixation Suppressed: Deferred   BPPV TESTS:  Symptoms Duration Intensity Nystagmus  L Dix-Hallpike None   Downbeating (does not fatigue)  R Dix-Hallpike Oscillopsia 10-15s  Downbeating (does not fatigue)  L Head Roll None   None  R Head Roll None   None  L Sidelying Test      R Sidelying Test      (blank = not tested)   Modified Clinical Test of Sensory Interaction for Balance (mCTSIB), 02/17/22:  CONDITION TIME SWAY  Eyes open, firm surface 30 seconds 2+  Eyes closed, firm surface 30 seconds 3+  Eyes open, foam surface 30 seconds 3+  Eyes closed, foam surface 6 seconds 4+    Oculomotor Exam- Fixation Suppressed (02/17/22)  Findings Comments  Ocular Alignment normal   Spontaneous Nystagmus abnormal R horizontal beating nystagmus  Gaze-Holding Nystagmus abnormal Worsening R  horizontal beating nystagmus with R gaze, Downbeating nystagmus with L gaze  End-Gaze Nystagmus abnormal See above  Head Shaking Nystagmus No change   Pressure-Induced Nystagmus No change   Hyperventilation Induced Nystagmus not examined   Skull Vibration Induced Nystagmus not examined     FUNCTIONAL OUTCOME MEASURES   02/17/22 02/21/22 Comments  BERG 51/56  Mild balance deficit  DGI     FGA 17/30  Severe dynamic instability  TUG     5TSTS 8.9s  WNL  6 Minute Walk Test     10 Meter Gait Speed     MOCA  24/30   (blank = not tested)   TODAY'S TREATMENT    SUBJECTIVE: Pt states that she is doing well today. She continues with significant imbalance. No stumbles or falls reported. No pain upon arrival and no specific questions or concerns currently.     PAIN: Denies   Neuromuscular Re-education  NuStep L2-4 x 6 minutes for warm-up during interval history (3 minutes unbilled); All balance exercises performed in // bars without UE  support unless otherwise specified:  Tandem balance alternating forward LE x 30s each; Tandem balance alternating forward LE with horizontal and vertical head turns x 30s each; Tandem gait forward 15' x 4; Airex balance beam tandem balance alternating forward LE x 30s each; Airex balance beam tandem balance alternating forward LE with horizontal and vertical head turns x 30s each; Airex balance beam tandem gait forward 8' x 4; Airex balance beam side stepping 8' x 4; VOR x 1 horizontal during forward gait 4 x 70' with increase in dizziness after each rep; Forward/retro gait in hallway with vertical ball lifts with head/eye follow 2 x 70' each; Forward gait in hallway with horizontal ball passes between hands with head/eye follow 3 x 70'; Airex feet together horizontal ball passes around body to therapist with ball return on opposite side at varying heights/distances, pt performing head/eye follow x multiple bouts on each side; Airex 6" alternating cone taps x 15 BLE; Alternating BOSU step-ups (round side up) x 10 leading with each LE;   Not Performed: Single leg balance x 30s on each LE; Sit to stand without UE support x 10, holding 4# med ball x 10; Squats with glute chair taps 2 x 10; Heel/toe raises x 10 each direction; Forward gait in hallway with ball passes from patient to therapist with head/eye follow 2 x 70' each direction; Airex feet together eyes open/closed x 30s each; Airex feet together horizontal and vertical head turns x 30s each;    PATIENT EDUCATION:  Education details: Balance exercises, Pt educated throughout session about proper posture and technique with exercises. Improved exercise technique, movement at target joints, use of target muscles after min to mod verbal, visual, tactile cues.  Person educated: Patient Education method: Explanation  Education comprehension: verbalized understanding    HOME EXERCISE PROGRAM: Access Code: [email or web address] URL:  [email or web address] Date: 02/21/2022 Prepared by: Roxana Hires  Exercises - Standing Tandem Balance with Counter Support  - 2 x daily - 7 x weekly - 3 x 30s with each foot forward hold - Standing Single Leg Stance with Counter Support  - 2 x daily - 7 x weekly - 3 x 30s on each foot hold   ASSESSMENT:  CLINICAL IMPRESSION: Continued with adaptation, habituation, and balance exercises during session today. Pt continues to report dizziness during VOR x 1 horizontal and progressed today to forward ambulation. Continued head turn/ball passes during gait in a hallway which remains  very difficult for patient. Horizontal ball passes remain particularly challenging. Utilized Airex balance beam to challenge pt on uneven surface. No HEP modifications on this date. Pt encouraged to follow-up as scheduled. Patient will benefit from skilled PT to address above impairments and improve overall function.   REHAB POTENTIAL: Fair    CLINICAL DECISION MAKING: Evolving/moderate complexity  EVALUATION COMPLEXITY: Moderate   GOALS: Goals reviewed with patient? No  SHORT TERM GOALS: Target date: 03/15/2022  Pt will be independent with HEP for dizziness in order to decrease symptoms, improve balance,decrease fall risk, and improve function at home. Baseline: Goal status: INITIAL   LONG TERM GOALS: Target date: 04/12/2022  Pt will increase FOTO to at least 58 to demonstrate significant improvement in function at home related to dizziness.  Baseline: 02/15/22: 50 Goal status: INITIAL  2.  Pt will improve FGA by at least 4 points in order to demonstrate clinically significant improvement in balance and decreased risk for falls.     Baseline: 02/15/22: To be completed; 02/17/22: 17/30 Goal status: Revised  3.  Pt will improve ABC by at least 13% in order to demonstrate clinically significant improvement in balance confidence.      Baseline: 02/15/22: To be completed; 02/17/22: 48.1% Goal status:  INITIAL  4. Pt will improve BERG by at least 3 points in order to demonstrate clinically significant improvement in balance and decreased risk for falls.     Baseline: 02/15/22: To be completed, 02/17/22: 51/56 Goal status: INITIAL   PLAN:  PT FREQUENCY: 1-2x/week  PT DURATION: 8 weeks  PLANNED INTERVENTIONS: Therapeutic exercises, Therapeutic activity, Neuromuscular re-education, Balance training, Gait training, Patient/Family education, Joint manipulation, Joint mobilization, Canalith repositioning, Aquatic Therapy, Dry Needling, Cognitive remediation, Electrical stimulation, Spinal manipulation, Spinal mobilization, Cryotherapy, Moist heat, Traction, Ultrasound, Ionotophoresis '4mg'$ /ml Dexamethasone, and Manual therapy  PLAN FOR NEXT SESSION:  continue adaptation, habituation, and balance exercises with patient;   Phillips Grout PT, DPT, GCS  Adger Cantera 03/09/2022, 8:11 AM

## 2022-03-08 ENCOUNTER — Ambulatory Visit: Payer: Medicare Other

## 2022-03-08 DIAGNOSIS — R2681 Unsteadiness on feet: Secondary | ICD-10-CM

## 2022-03-08 DIAGNOSIS — R42 Dizziness and giddiness: Secondary | ICD-10-CM | POA: Diagnosis not present

## 2022-03-09 NOTE — Therapy (Signed)
OUTPATIENT PHYSICAL THERAPY VESTIBULAR TREATMENT   Patient Name: Julie Hunter MRN: [id number] DOB:Jan 23, 1956, 66 y.o., female Today's Date: 03/10/2022  PCP: Patient, No Pcp Per REFERRING PROVIDER: Molli Knock, MD   PT End of Session - 03/10/22 1016     Visit Number 8    Number of Visits 17    Date for PT Re-Evaluation 04/12/22    Authorization Type eval: 02/15/22    PT Start Time 1015    PT Stop Time 1100    PT Time Calculation (min) 45 min    Activity Tolerance Patient tolerated treatment well    Behavior During Therapy WFL for tasks assessed/performed              Past Medical History:  Diagnosis Date   Glaucoma    Past Surgical History:  Procedure Laterality Date   ROTATOR CUFF REPAIR Right    SHOULDER SURGERY Left    There are no problems to display for this patient.   PCP: Patient, No Pcp Per  REFERRING PROVIDER: Molli Knock, MD  REFERRING DIAGNOSIS: R42 (ICD-10-CM) - Dizziness and giddiness  THERAPY DIAG: Unsteadiness on feet  RATIONALE FOR EVALUATION AND TREATMENT: Rehabilitation  ONSET DATE: 03/10/2021 (approximate)  FOLLOW UP APPT WITH PROVIDER: No    SUBJECTIVE:   Chief Complaint:  Dizziness and unsteadiness  Pertinent History Pt referred secondary to complaints of occasional dizzy spells as well as imbalance. Symptoms first started sometime before last October and accelerated between her first and second COVID vaccination in the fall. She has noted progressive worsening of her balance as well as progressive difficulty with her short term memory. Pt now tries to hold her husbands arm when walking to facilitate balance and states that if she is holding his arm she is stable. Symptoms never occur when laying down, rolling in bed, or looking up. She denies any true vertigo. 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 Per ENT note pt had a blocked artery several years ago and was placed on baby aspirin. She was  also found to have bilateral sensorineural hearing loss and they are recommending hearing aids. Pt started noticing a blurry spot in her left eye in 2014 and while she was undergoing work-up by optho she had an MRI done which demonstrated brian atrophy, in particular the posterior fossa. She was referred to neurology and at that time she denied ever noticing any memory problems or gait difficulties. Per neurology note pt denied any family history of similar symptoms although mother did have dementia in late-life and father had what she thinks was PSP. She was followed by [facility] neurology from 10/17/16 through 04/01/19. 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 Pt denies having seen neurology since 2020. According to the last neurology note there were initially concerns about a neurocognitive or neurodegenerative process. However, she has followed up for 2.5 years and she developed no abnormalities on her examination. 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 She was advised to follow up if she noticed any gait changes, falls, or more objective cognitive difficulties.   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   COMPARISON: None   TECHNIQUE/PROTOCOL: Standard orbits pre and post contrast protocol MRI  performed, including additional imaging pre and post contrast of the brain.   CONTRAST: 55m MultiHance IV. This MRI was performed before and after IV  administration of contrast material. IV contrast was administered to  improve disease detection and further  define anatomy.  *    Complications:  No immediate patient complications or events noted.   FINDINGS:  Globes: Normal.  Lenses: Normal appearing native lenses.  Optic nerves: Normal.  Intraconal fat: Normal.  Intraocular hemorrhage: Normal.  Extraocular muscles: Normal.  Intraorbital vasculature: Normal.  Lacrimal glands: Normal.   Cerebrum: No mass, mass effect, acute infarct or hemorrhage. Markedly  advanced for age global cerebral atrophy, which is disproportionately worse  within the pons and medulla.  Intracranial flow voids: Normal.  Extra-axial spaces, basal cisterns, ventricles and sulci: Normal.  Cranium: Normal.  Paranasal sinuses: Normal.  Mastoid sinuses: Normal.   IMPRESSION:  1. Normal MRI of the orbits.  2. 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 This may be seen in multisystem atrophy, but  other etiologies are possible. Correlate clinically.    Description of dizziness: unsteadiness and whoozy Frequency: Multiple times throughout the day Duration: dizziness a few seconds but constant unsteadiness Symptom nature: intermittent, spontaneous, and positional Progression of symptoms since onset: worse, increasing frequency History of similar episodes: No  Provocative Factors: bending forward, sit to stand, turning quickly, no problems with laying down or rolling in bed Easing Factors:  waiting for symptoms to pass.  Auditory complaints (tinnitus, pain, drainage, hearing loss, aural fullness): Yes, pt reports hearing loss and occasional aural fullness but otherwise no changes; Vision changes (diplopia, visual field loss, recent changes, recent eye exam): Yes, chronic double vision intermittently when fatigued (in the last couple months) Chest pain/palpitations: No History of head injury/concussion: No Stress/anxiety: No Numbness/tingling: Denies Focal weakness: Denies but reports progressive weakness  in grip strength; Headaches/migraines: Denies  Has patient fallen in last 6 months? Yes, Number of falls: 1 Pertinent pain: No Dominant hand: right Imaging: Yes  Prior level of function: Independent for ADLs/IADLs Occupational demands: Not working Hobbies: Pt has animals on her property for which she cares  Red Flags: Positive: occasional dysphagia, weight loss (approximately 40# over the last 2-3 years, poor appetite and partially accounted by intentional weight loss), Denies: dysarthria, bowel and bladder changes, chills, fever, or night sweats;  PRECAUTIONS: None  WEIGHT BEARING RESTRICTIONS No  LIVING ENVIRONMENT: Lives with: lives with their spouse Lives in: House/apartment, 2 stories Stairs: Yes; 12-15 stairs inside railing on L during ascend Has following equipment at home: None  PATIENT GOALS: Improve balance, pt wants to be able to walk to the mailbox (gravel driveway about 0.5 miles);   OBJECTIVE  EXAMINATION  POSTURE: No gross deficits contributing to symptoms  NEUROLOGICAL SCREEN: (2+ unless otherwise noted.) N=normal  Ab=abnormal  Level Dermatome R L Myotome R L Reflex R L  C3 Anterior Neck N N Sidebend C2-3 N N Jaw CN V    C4 Top of Shoulder N N Shoulder Shrug C4 N N Hoffman's UMN    C5 Lateral Upper Arm N N Shoulder ABD C4-5 N N Biceps C5-6    C6 Lateral Arm/ Thumb N N Arm Flex/ Wrist Ext C5-6 N N Brachiorad. C5-6    C7 Middle Finger N N Arm Ext//Wrist Flex C6-7 N N Triceps C7    C8  4th & 5th Finger N N Flex/ Ext Carpi Ulnaris C8 N N Patellar (L3-4)    T1 Medial Arm N N Interossei T1 N N Gastrocnemius    L2 Medial thigh/groin N N Illiopsoas (L2-3) N N     L3 Lower thigh/med.knee N N Quadriceps (L3-4) N N     L4 Medial leg/lat thigh N N Tibialis Ant (L4-5) N N     L5 Lat. leg & dorsal foot N N EHL (L5) N N     S1 post/lat foot/thigh/leg N N Gastrocnemius (S1-2) N N     S2 Post./med. thigh & leg N N Hamstrings (L4-S3) N N       CRANIAL NERVES II, III,  IV, VI: Pupils equal and reactive to light, visual acuity and visual fields are intact, extraocular muscles are intact  V: Facial sensation is intact and symmetric bilaterally  VII: Facial strength is intact and symmetric bilaterally  VIII: Hearing is normal as tested by gross conversation IX, X: Palate elevates midline, normal phonation, uvula midline XI: Shoulder shrug strength is intact  XII: Tongue protrudes midline    SOMATOSENSORY Grossly intact to light touch bilateral UEs/LEs as determined by testing dermatomes C2-T2 and L2-S2. Proprioception and hot/cold testing deferred on this date.   COORDINATION Finger to Nose: Dysmetria bilaterally Heel to Shin: Normal Pronator Drift: Positive on the right Rapid Alternating Movements: Normal Finger to Thumb Opposition: Normal Ataxic gait noted with wide base of support and pt leaning slightly backwards    RANGE OF MOTION Cervical Spine AROM WFL and painless in all planes. No focal deficits in AROM noted in BUE/BLE   MANUAL MUSCLE TESTING BUE/BLE strength WNL without focal deficits with the exception of 4/5 bilateral hip flexion, 4/5 bilateral ankle dorsiflexion, and decreased bilateral grip strength;   TRANSFERS/GAIT Independent for transfers and ambulation without assistive device    PATIENT SURVEYS FOTO: 50, predicted improvement to 58 ABC: To be completed, 02/17/22: 48.1%   POSTURAL CONTROL TESTS  Clinical Test of Sensory Interaction for Balance (CTSIB): Deferred  OCULOMOTOR / VESTIBULAR TESTING  Oculomotor Exam- Room Light  Findings Comments  Ocular Alignment normal   Ocular ROM normal   Spontaneous Nystagmus normal   Gaze-Holding Nystagmus normal   End-Gaze Nystagmus normal   Vergence (normal 2-3") not examined   Smooth Pursuit abnormal Saccadic  Cross-Cover Test not examined   Saccades abnormal Slow and consistently hypometric  VOR Cancellation abnormal Consistent saccades  Left Head Impulse abnormal  Corrective saccade required  Right Head Impulse normal   Static Acuity normal (02/23/22) 20/25 (line 7)  Dynamic Acuity abnormal (02/23/22) 20/70 (line 3)    Oculomotor Exam- Fixation Suppressed: Deferred   BPPV TESTS:  Symptoms Duration Intensity Nystagmus  L Dix-Hallpike None   Downbeating (does not fatigue)  R Dix-Hallpike Oscillopsia 10-15s  Downbeating (does not fatigue)  L Head Roll None   None  R Head Roll None   None  L Sidelying Test      R Sidelying Test      (blank = not tested)   Modified Clinical Test of Sensory Interaction for Balance (mCTSIB), 02/17/22:  CONDITION TIME SWAY  Eyes open, firm surface 30 seconds 2+  Eyes closed, firm surface 30 seconds 3+  Eyes open, foam surface 30 seconds 3+  Eyes closed, foam surface 6 seconds 4+    Oculomotor Exam- Fixation Suppressed (02/17/22)  Findings Comments  Ocular Alignment normal   Spontaneous Nystagmus abnormal R horizontal beating nystagmus  Gaze-Holding Nystagmus  abnormal Worsening R horizontal beating nystagmus with R gaze, Downbeating nystagmus with L gaze  End-Gaze Nystagmus abnormal See above  Head Shaking Nystagmus No change   Pressure-Induced Nystagmus No change   Hyperventilation Induced Nystagmus not examined   Skull Vibration Induced Nystagmus not examined     FUNCTIONAL OUTCOME MEASURES   02/17/22 02/21/22 Comments  BERG 51/56  Mild balance deficit  DGI     FGA 17/30  Severe dynamic instability  TUG     5TSTS 8.9s  WNL  6 Minute Walk Test     10 Meter Gait Speed     MOCA  24/30   (blank = not tested)   TODAY'S TREATMENT    SUBJECTIVE: Pt states that she is doing well today. She continues with significant imbalance. No stumbles or falls reported. No pain upon arrival and no specific questions currently.     PAIN: Denies   Neuromuscular Re-education  Pre-exercise vitals: BP: 130/76 mmHg, HR: 90 bpm, SpO2: 100% NuStep L2-4 x 5 minutes for warm-up during interval history (3 minutes  unbilled);  All balance exercises performed in // bars without UE support unless otherwise specified: Tandem gait forward 15' x 4; 1/2 foam roll (round side up) static balance x 60s, attempted to add head turns but too difficult for patient; 1/2 foam roll (flat side up) static balance x 60s; Airex balance beam tandem gait forward 8' x 4; Airex balance beam side stepping 8' x 4; Forward/retro gait in hallway with vertical ball lifts with head/eye follow 2 x 70' each; Forward/retro gait in hallway with horizontal ball passes between hands with head/eye follow 3 x 70'; VOR x 1 horizontal standing feet apart with target at arms length on plain wall 2 x 60s with increase in dizziness after each repetition; HEP updated and reviewed with patient;   Not Performed: Tandem balance alternating forward LE x 30s each; Tandem balance alternating forward LE with horizontal and vertical head turns x 30s each; Single leg balance x 30s on each LE; Sit to stand without UE support x 10, holding 4# med ball x 10; Squats with glute chair taps 2 x 10; Heel/toe raises x 10 each direction; Forward gait in hallway with ball passes from patient to therapist with head/eye follow 2 x 70' each direction; Airex feet together eyes open/closed x 30s each; Airex feet together horizontal and vertical head turns x 30s each; Airex balance beam tandem balance alternating forward LE x 30s each; Airex balance beam tandem balance alternating forward LE with horizontal and vertical head turns x 30s each; Airex feet together horizontal ball passes around body to therapist with ball return on opposite side at varying heights/distances, pt performing head/eye follow x multiple bouts on each side; Airex 6" alternating cone taps x 15 BLE; Alternating BOSU step-ups (round side up) x 10 leading with each LE;    PATIENT EDUCATION:  Education details: Balance exercises, Pt educated throughout session about proper posture and  technique with exercises. Improved exercise technique, movement at target joints, use of target muscles after min to mod verbal, visual, tactile cues.  Person educated: Patient Education method: Explanation  Education comprehension: verbalized understanding    HOME EXERCISE PROGRAM: Access Code: [email or web address] URL: [email or web address] Date: 03/10/2022 Prepared by: Roxana Hires  Exercises - Standing Tandem Balance with Counter Support  - 2 x daily - 7 x weekly - 3 x 30s with each foot forward hold - Standing Single Leg Stance with Counter Support  - 2 x daily -  7 x weekly - 3 x 30s on each foot hold - Tandem Walking Next to Counter  - 2 x daily - 7 x weekly - 3 x 60s hold - Standing Gaze Stabilization with Head Rotation  - 2 x daily - 7 x weekly - 3 x 60s hold - Corner Balance Feet Together With Eyes Closed  - 2 x daily - 7 x weekly - 3 x 30s hold   ASSESSMENT:  CLINICAL IMPRESSION: Continued with adaptation, habituation, and balance exercises during session today. Pt continues to report dizziness during VOR x 1 horizontal. Continued head turn/ball passes during gait in a hallway which remains very difficult for patient. Added balance on 1/2 foam roll which is very challenging for patient. HEP updated during visit and reviewed with patient; Pt encouraged to follow-up as scheduled. Patient will benefit from skilled PT to address above impairments and improve overall function.   REHAB POTENTIAL: Fair    CLINICAL DECISION MAKING: Evolving/moderate complexity  EVALUATION COMPLEXITY: Moderate   GOALS: Goals reviewed with patient? No  SHORT TERM GOALS: Target date: 03/15/2022  Pt will be independent with HEP for dizziness in order to decrease symptoms, improve balance,decrease fall risk, and improve function at home. Baseline: Goal status: INITIAL   LONG TERM GOALS: Target date: 04/12/2022  Pt will increase FOTO to at least 58 to demonstrate significant improvement in  function at home related to dizziness.  Baseline: 02/15/22: 50 Goal status: INITIAL  2.  Pt will improve FGA by at least 4 points in order to demonstrate clinically significant improvement in balance and decreased risk for falls.     Baseline: 02/15/22: To be completed; 02/17/22: 17/30 Goal status: Revised  3.  Pt will improve ABC by at least 13% in order to demonstrate clinically significant improvement in balance confidence.      Baseline: 02/15/22: To be completed; 02/17/22: 48.1% Goal status: INITIAL  4. Pt will improve BERG by at least 3 points in order to demonstrate clinically significant improvement in balance and decreased risk for falls.     Baseline: 02/15/22: To be completed, 02/17/22: 51/56 Goal status: INITIAL   PLAN:  PT FREQUENCY: 1-2x/week  PT DURATION: 8 weeks  PLANNED INTERVENTIONS: Therapeutic exercises, Therapeutic activity, Neuromuscular re-education, Balance training, Gait training, Patient/Family education, Joint manipulation, Joint mobilization, Canalith repositioning, Aquatic Therapy, Dry Needling, Cognitive remediation, Electrical stimulation, Spinal manipulation, Spinal mobilization, Cryotherapy, Moist heat, Traction, Ultrasound, Ionotophoresis '4mg'$ /ml Dexamethasone, and Manual therapy  PLAN FOR NEXT SESSION:  continue adaptation, habituation, and balance exercises with patient;   Phillips Grout PT, DPT, GCS  Katriana Dortch 03/10/2022, 11:03 AM

## 2022-03-10 ENCOUNTER — Ambulatory Visit: Payer: Medicare Other | Attending: Otolaryngology

## 2022-03-10 DIAGNOSIS — R2681 Unsteadiness on feet: Secondary | ICD-10-CM | POA: Diagnosis present

## 2022-03-10 DIAGNOSIS — R42 Dizziness and giddiness: Secondary | ICD-10-CM | POA: Diagnosis present

## 2022-03-15 ENCOUNTER — Ambulatory Visit: Payer: Medicare Other

## 2022-03-15 DIAGNOSIS — R2681 Unsteadiness on feet: Secondary | ICD-10-CM

## 2022-03-15 DIAGNOSIS — R42 Dizziness and giddiness: Secondary | ICD-10-CM

## 2022-03-15 NOTE — Therapy (Signed)
OUTPATIENT PHYSICAL THERAPY VESTIBULAR TREATMENT   Patient Name: Julie Hunter MRN: [id number] DOB:[birth year], 66 y.o., female Today's Date: 03/15/2022  PCP: Patient, No Pcp Per REFERRING PROVIDER: Molli Knock, MD   PT End of Session - 03/15/22 1500     Visit Number 9    Number of Visits 17    Date for PT Re-Evaluation 04/12/22    Authorization Type eval: 02/15/22    PT Start Time 1115    PT Stop Time 1200    PT Time Calculation (min) 45 min    Activity Tolerance Patient tolerated treatment well    Behavior During Therapy WFL for tasks assessed/performed              Past Medical History:  Diagnosis Date   Glaucoma    Past Surgical History:  Procedure Laterality Date   ROTATOR CUFF REPAIR Right    SHOULDER SURGERY Left    There are no problems to display for this patient.   PCP: Patient, No Pcp Per  REFERRING PROVIDER: Molli Knock, MD  REFERRING DIAGNOSIS: R42 (ICD-10-CM) - Dizziness and giddiness  THERAPY DIAG: Unsteadiness on feet  Dizziness and giddiness  RATIONALE FOR EVALUATION AND TREATMENT: Rehabilitation  ONSET DATE: 03/10/2021 (approximate)  FOLLOW UP APPT WITH PROVIDER: No    SUBJECTIVE:   Chief Complaint:  Dizziness and unsteadiness  Pertinent History Pt referred secondary to complaints of occasional dizzy spells as well as imbalance. Symptoms first started sometime before last October and accelerated between her first and second COVID vaccination in the fall. She has noted progressive worsening of her balance as well as progressive difficulty with her short term memory. Pt now tries to hold her husbands arm when walking to facilitate balance and states that if she is holding his arm she is stable. Symptoms never occur when laying down, rolling in bed, or looking up. She denies any true vertigo. 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 Per ENT note pt had a blocked artery several years ago and was placed  on baby aspirin. She was also found to have bilateral sensorineural hearing loss and they are recommending hearing aids. Pt started noticing a blurry spot in her left eye in 2014 and while she was undergoing work-up by optho she had an MRI done which demonstrated brian atrophy, in particular the posterior fossa. She was referred to neurology and at that time she denied ever noticing any memory problems or gait difficulties. Per neurology note pt denied any family history of similar symptoms although mother did have dementia in late-life and father had what she thinks was PSP. She was followed by [facility] from 10/17/16 through 04/01/19. 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 Pt denies having seen neurology since 2020. According to the last neurology note there were initially concerns about a neurocognitive or neurodegenerative process. However, she has followed up for 2.5 years and she developed no abnormalities on her examination. 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 She was advised to follow up if she noticed any gait changes, falls, or more objective cognitive difficulties.   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   COMPARISON: None   TECHNIQUE/PROTOCOL: Standard orbits pre and post contrast protocol MRI  performed, including additional imaging pre and post contrast of the brain.   CONTRAST: 40m MultiHance IV. This MRI was performed before and after IV  administration of contrast material. IV contrast was administered to  improve disease  detection and further define anatomy.  *    Complications:  No immediate patient complications or events noted.   FINDINGS:  Globes: Normal.  Lenses: Normal appearing native lenses.  Optic nerves: Normal.  Intraconal fat: Normal.  Intraocular hemorrhage: Normal.  Extraocular muscles: Normal.  Intraorbital vasculature: Normal.  Lacrimal glands: Normal.   Cerebrum: No mass, mass effect, acute infarct or hemorrhage. Markedly  advanced for age global cerebral atrophy, which is disproportionately worse  within the pons and medulla.  Intracranial flow voids: Normal.  Extra-axial spaces, basal cisterns, ventricles and sulci: Normal.  Cranium: Normal.  Paranasal sinuses: Normal.  Mastoid sinuses: Normal.   IMPRESSION:  1. Normal MRI of the orbits.  2. 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 This may be seen in multisystem atrophy, but  other etiologies are possible. Correlate clinically.    Description of dizziness: unsteadiness and whoozy Frequency: Multiple times throughout the day Duration: dizziness a few seconds but constant unsteadiness Symptom nature: intermittent, spontaneous, and positional Progression of symptoms since onset: worse, increasing frequency History of similar episodes: No  Provocative Factors: bending forward, sit to stand, turning quickly, no problems with laying down or rolling in bed Easing Factors:  waiting for symptoms to pass.  Auditory complaints (tinnitus, pain, drainage, hearing loss, aural fullness): Yes, pt reports hearing loss and occasional aural fullness but otherwise no changes; Vision changes (diplopia, visual field loss, recent changes, recent eye exam): Yes, chronic double vision intermittently when fatigued (in the last couple months) Chest pain/palpitations: No History of head injury/concussion: No Stress/anxiety: No Numbness/tingling: Denies Focal weakness: Denies but reports  progressive weakness in grip strength; Headaches/migraines: Denies  Has patient fallen in last 6 months? Yes, Number of falls: 1 Pertinent pain: No Dominant hand: right Imaging: Yes  Prior level of function: Independent for ADLs/IADLs Occupational demands: Not working Hobbies: Pt has animals on her property for which she cares  Red Flags: Positive: occasional dysphagia, weight loss (approximately 40# over the last 2-3 years, poor appetite and partially accounted by intentional weight loss), Denies: dysarthria, bowel and bladder changes, chills, fever, or night sweats;  PRECAUTIONS: None  WEIGHT BEARING RESTRICTIONS No  LIVING ENVIRONMENT: Lives with: lives with their spouse Lives in: House/apartment, 2 stories Stairs: Yes; 12-15 stairs inside railing on L during ascend Has following equipment at home: None  PATIENT GOALS: Improve balance, pt wants to be able to walk to the mailbox (gravel driveway about 0.5 miles);   OBJECTIVE  EXAMINATION  POSTURE: No gross deficits contributing to symptoms  NEUROLOGICAL SCREEN: (2+ unless otherwise noted.) N=normal  Ab=abnormal  Level Dermatome R L Myotome R L Reflex R L  C3 Anterior Neck N N Sidebend C2-3 N N Jaw CN V    C4 Top of Shoulder N N Shoulder Shrug C4 N N Hoffman's UMN    C5 Lateral Upper Arm N N Shoulder ABD C4-5 N N Biceps C5-6    C6 Lateral Arm/ Thumb N N Arm Flex/ Wrist Ext C5-6 N N Brachiorad. C5-6    C7 Middle Finger N N Arm Ext//Wrist Flex C6-7 N N Triceps C7  C8 4th & 5th Finger N N Flex/ Ext Carpi Ulnaris C8 N N Patellar (L3-4)    T1 Medial Arm N N Interossei T1 N N Gastrocnemius    L2 Medial thigh/groin N N Illiopsoas (L2-3) N N     L3 Lower thigh/med.knee N N Quadriceps (L3-4) N N     L4 Medial leg/lat thigh N N Tibialis Ant (L4-5) N N     L5 Lat. leg & dorsal foot N N EHL (L5) N N     S1 post/lat foot/thigh/leg N N Gastrocnemius (S1-2) N N     S2 Post./med. thigh & leg N N Hamstrings (L4-S3) N N        CRANIAL NERVES II, III, IV, VI: Pupils equal and reactive to light, visual acuity and visual fields are intact, extraocular muscles are intact  V: Facial sensation is intact and symmetric bilaterally  VII: Facial strength is intact and symmetric bilaterally  VIII: Hearing is normal as tested by gross conversation IX, X: Palate elevates midline, normal phonation, uvula midline XI: Shoulder shrug strength is intact  XII: Tongue protrudes midline    SOMATOSENSORY Grossly intact to light touch bilateral UEs/LEs as determined by testing dermatomes C2-T2 and L2-S2. Proprioception and hot/cold testing deferred on this date.   COORDINATION Finger to Nose: Dysmetria bilaterally Heel to Shin: Normal Pronator Drift: Positive on the right Rapid Alternating Movements: Normal Finger to Thumb Opposition: Normal Ataxic gait noted with wide base of support and pt leaning slightly backwards    RANGE OF MOTION Cervical Spine AROM WFL and painless in all planes. No focal deficits in AROM noted in BUE/BLE   MANUAL MUSCLE TESTING BUE/BLE strength WNL without focal deficits with the exception of 4/5 bilateral hip flexion, 4/5 bilateral ankle dorsiflexion, and decreased bilateral grip strength;   TRANSFERS/GAIT Independent for transfers and ambulation without assistive device    PATIENT SURVEYS FOTO: 50, predicted improvement to 58 ABC: To be completed, 02/17/22: 48.1%   POSTURAL CONTROL TESTS  Clinical Test of Sensory Interaction for Balance (CTSIB): Deferred  OCULOMOTOR / VESTIBULAR TESTING  Oculomotor Exam- Room Light  Findings Comments  Ocular Alignment normal   Ocular ROM normal   Spontaneous Nystagmus normal   Gaze-Holding Nystagmus normal   End-Gaze Nystagmus normal   Vergence (normal 2-3") not examined   Smooth Pursuit abnormal Saccadic  Cross-Cover Test not examined   Saccades abnormal Slow and consistently hypometric  VOR Cancellation abnormal Consistent saccades   Left Head Impulse abnormal Corrective saccade required  Right Head Impulse normal   Static Acuity normal (02/23/22) 20/25 (line 7)  Dynamic Acuity abnormal (02/23/22) 20/70 (line 3)    Oculomotor Exam- Fixation Suppressed: Deferred   BPPV TESTS:  Symptoms Duration Intensity Nystagmus  L Dix-Hallpike None   Downbeating (does not fatigue)  R Dix-Hallpike Oscillopsia 10-15s  Downbeating (does not fatigue)  L Head Roll None   None  R Head Roll None   None  L Sidelying Test      R Sidelying Test      (blank = not tested)   Modified Clinical Test of Sensory Interaction for Balance (mCTSIB), 02/17/22:  CONDITION TIME SWAY  Eyes open, firm surface 30 seconds 2+  Eyes closed, firm surface 30 seconds 3+  Eyes open, foam surface 30 seconds 3+  Eyes closed, foam surface 6 seconds 4+    Oculomotor Exam- Fixation Suppressed (02/17/22)  Findings Comments  Ocular Alignment normal   Spontaneous Nystagmus abnormal R horizontal beating nystagmus  Gaze-Holding  Nystagmus abnormal Worsening R horizontal beating nystagmus with R gaze, Downbeating nystagmus with L gaze  End-Gaze Nystagmus abnormal See above  Head Shaking Nystagmus No change   Pressure-Induced Nystagmus No change   Hyperventilation Induced Nystagmus not examined   Skull Vibration Induced Nystagmus not examined     FUNCTIONAL OUTCOME MEASURES   02/17/22 02/21/22 Comments  BERG 51/56  Mild balance deficit  DGI     FGA 17/30  Severe dynamic instability  TUG     5TSTS 8.9s  WNL  6 Minute Walk Test     10 Meter Gait Speed     MOCA  24/30   (blank = not tested)   TODAY'S TREATMENT    SUBJECTIVE: Pt states she has not had any falls at home since last visit.  She does notice sometimes she feels wobbly, like she is moving/swaying when she is not, it doesn't feel like the room is spinning.  She does not have her sneakers on today- she grabbed her crocs on her way out the door.   PAIN: Denies   Neuromuscular Re-education    NuStep L2-4 x 5 minutes for warm-up during interval history (3 minutes unbilled);  All balance exercises performed in // bars without UE support unless otherwise specified: Tandem gait forward 15' x 4; 1/2 foam roll (round side up) static balance x 60s x2 ea Airex balance beam tandem gait forward 8' x 4; Airex balance beam side stepping 8' x 4; Forward/retro gait in hallway with vertical ball lifts with head/eye follow 2 x 70' each; Forward/retro gait in hallway with horizontal ball passes between hands with head/eye follow 3 x 70'; Forward gait in hallway with ball passes from patient to therapist with head/eye follow 2 x 70' each direction; VOR x 1 horizontal/vertical standing feet apart with target at arms length on plain wall 2 x 60s with no increase in dizziness noted today Airex feet together eyes open/closed x 30s each;   Not Performed: 1/2 foam roll (flat side up) static balance x 60s; Tandem balance alternating forward LE x 30s each; Tandem balance alternating forward LE with horizontal and vertical head turns x 30s each; Single leg balance x 30s on each LE; Sit to stand without UE support x 10, holding 4# med ball x 10; Squats with glute chair taps 2 x 10; Heel/toe raises x 10 each direction; Airex feet together horizontal and vertical head turns x 30s each; Airex balance beam tandem balance alternating forward LE x 30s each; Airex balance beam tandem balance alternating forward LE with horizontal and vertical head turns x 30s each; Airex feet together horizontal ball passes around body to therapist with ball return on opposite side at varying heights/distances, pt performing head/eye follow x multiple bouts on each side; Airex 6" alternating cone taps x 15 BLE; Alternating BOSU step-ups (round side up) x 10 leading with each LE;    PATIENT EDUCATION:  Education details: Balance exercises, Pt educated throughout session about proper posture and technique with exercises.  Improved exercise technique, movement at target joints, use of target muscles after min to mod verbal, visual, tactile cues.  Person educated: Patient Education method: Explanation  Education comprehension: verbalized understanding    HOME EXERCISE PROGRAM: Access Code: [email or web address] URL: [email or web address] Date: 03/10/2022 Prepared by: Roxana Hires  Exercises - Standing Tandem Balance with Counter Support  - 2 x daily - 7 x weekly - 3 x 30s with each foot forward hold - Standing Single Leg Stance with Counter Support  -  2 x daily - 7 x weekly - 3 x 30s on each foot hold - Tandem Walking Next to Counter  - 2 x daily - 7 x weekly - 3 x 60s hold - Standing Gaze Stabilization with Head Rotation  - 2 x daily - 7 x weekly - 3 x 60s hold - Corner Balance Feet Together With Eyes Closed  - 2 x daily - 7 x weekly - 3 x 30s hold   ASSESSMENT:  CLINICAL IMPRESSION: Continued with adaptation, habituation, and balance exercises during session today. Pt did not report dizziness during VOR activities today. The head turn/ball passes during gait in a hallway continue to be the most challenging intervention today.  Patient will benefit from skilled PT to address above impairments and improve overall function.   REHAB POTENTIAL: Fair    CLINICAL DECISION MAKING: Evolving/moderate complexity  EVALUATION COMPLEXITY: Moderate   GOALS: Goals reviewed with patient? No  SHORT TERM GOALS: Target date: 03/15/2022  Pt will be independent with HEP for dizziness in order to decrease symptoms, improve balance,decrease fall risk, and improve function at home. Baseline: Goal status: INITIAL   LONG TERM GOALS: Target date: 04/12/2022  Pt will increase FOTO to at least 58 to demonstrate significant improvement in function at home related to dizziness.  Baseline: 02/15/22: 50 Goal status: INITIAL  2.  Pt will improve FGA by at least 4 points in order to demonstrate clinically significant  improvement in balance and decreased risk for falls.     Baseline: 02/15/22: To be completed; 02/17/22: 17/30 Goal status: Revised  3.  Pt will improve ABC by at least 13% in order to demonstrate clinically significant improvement in balance confidence.      Baseline: 02/15/22: To be completed; 02/17/22: 48.1% Goal status: INITIAL  4. Pt will improve BERG by at least 3 points in order to demonstrate clinically significant improvement in balance and decreased risk for falls.     Baseline: 02/15/22: To be completed, 02/17/22: 51/56 Goal status: INITIAL   PLAN:  PT FREQUENCY: 1-2x/week  PT DURATION: 8 weeks  PLANNED INTERVENTIONS: Therapeutic exercises, Therapeutic activity, Neuromuscular re-education, Balance training, Gait training, Patient/Family education, Joint manipulation, Joint mobilization, Canalith repositioning, Aquatic Therapy, Dry Needling, Cognitive remediation, Electrical stimulation, Spinal manipulation, Spinal mobilization, Cryotherapy, Moist heat, Traction, Ultrasound, Ionotophoresis '4mg'$ /ml Dexamethasone, and Manual therapy  PLAN FOR NEXT SESSION:  continue adaptation, habituation, and balance exercises with patient;  Pincus Badder 03/15/2022, 3:01 PM  Merdis Delay, PT, DPT, OCS  [phone]   [facility] [address] [place], [place], [address] Phone: [phone]   Fax:  [phone]  Patient Details  Name: Vernette Moise MRN: [id number] Date of Birth: 1955-07-13 Referring Provider:  Molli Knock, MD  Encounter Date: 03/15/2022   Pincus Badder, PT 03/15/2022, 3:01 PM   [facility] [address] [place], [place], [address] Phone: [phone]   Fax:  [phone]

## 2022-03-17 ENCOUNTER — Ambulatory Visit: Payer: Medicare Other

## 2022-03-17 DIAGNOSIS — R2681 Unsteadiness on feet: Secondary | ICD-10-CM | POA: Diagnosis not present

## 2022-03-17 DIAGNOSIS — R42 Dizziness and giddiness: Secondary | ICD-10-CM

## 2022-03-17 NOTE — Therapy (Signed)
OUTPATIENT PHYSICAL THERAPY VESTIBULAR TREATMENT/PROGRESS NOTE   Patient Name: Julie Hunter MRN: [id number] DOB:1955-10-28, 66 y.o., female Today's Date: 03/17/2022  PCP: Patient, No Pcp Per REFERRING PROVIDER: Molli Knock, MD   PT End of Session - 03/17/22 1104     Visit Number 10    Number of Visits 17    Date for PT Re-Evaluation 04/12/22    Authorization Type eval: 02/15/22    PT Start Time 1100    PT Stop Time 1145    PT Time Calculation (min) 45 min    Activity Tolerance Patient tolerated treatment well    Behavior During Therapy WFL for tasks assessed/performed              Past Medical History:  Diagnosis Date   Glaucoma    Past Surgical History:  Procedure Laterality Date   ROTATOR CUFF REPAIR Right    SHOULDER SURGERY Left    There are no problems to display for this patient.   PCP: Patient, No Pcp Per  REFERRING PROVIDER: Molli Knock, MD  REFERRING DIAGNOSIS: R42 (ICD-10-CM) - Dizziness and giddiness  THERAPY DIAG: Unsteadiness on feet  Dizziness and giddiness  RATIONALE FOR EVALUATION AND TREATMENT: Rehabilitation  ONSET DATE: 03/10/2021 (approximate)  FOLLOW UP APPT WITH PROVIDER: No    SUBJECTIVE:   Chief Complaint:  Dizziness and unsteadiness  Pertinent History Pt referred secondary to complaints of occasional dizzy spells as well as imbalance. Symptoms first started sometime before last October and accelerated between her first and second COVID vaccination in the fall. She has noted progressive worsening of her balance as well as progressive difficulty with her short term memory. Pt now tries to hold her husbands arm when walking to facilitate balance and states that if she is holding his arm she is stable. Symptoms never occur when laying down, rolling in bed, or looking up. She denies any true vertigo. 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 Per ENT note pt had a blocked artery several years ago  and was placed on baby aspirin. She was also found to have bilateral sensorineural hearing loss and they are recommending hearing aids. Pt started noticing a blurry spot in her left eye in 2014 and while she was undergoing work-up by optho she had an MRI done which demonstrated brian atrophy, in particular the posterior fossa. She was referred to neurology and at that time she denied ever noticing any memory problems or gait difficulties. Per neurology note pt denied any family history of similar symptoms although mother did have dementia in late-life and father had what she thinks was PSP. She was followed by [facility] from 10/17/16 through 04/01/19. 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 Pt denies having seen neurology since 2020. According to the last neurology note there were initially concerns about a neurocognitive or neurodegenerative process. However, she has followed up for 2.5 years and she developed no abnormalities on her examination. 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 She was advised to follow up if she noticed any gait changes, falls, or more objective cognitive difficulties.   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   COMPARISON: None   TECHNIQUE/PROTOCOL: Standard orbits pre and post contrast protocol MRI  performed, including additional imaging pre and post contrast of the brain.   CONTRAST: 50m MultiHance IV. This MRI was performed before and after IV  administration of contrast material. IV contrast was administered to   improve disease detection and further define anatomy.  *    Complications:  No immediate patient complications or events noted.   FINDINGS:  Globes: Normal.  Lenses: Normal appearing native lenses.  Optic nerves: Normal.  Intraconal fat: Normal.  Intraocular hemorrhage: Normal.  Extraocular muscles: Normal.  Intraorbital vasculature: Normal.  Lacrimal glands: Normal.   Cerebrum: No mass, mass effect, acute infarct or hemorrhage. Markedly  advanced for age global cerebral atrophy, which is disproportionately worse  within the pons and medulla.  Intracranial flow voids: Normal.  Extra-axial spaces, basal cisterns, ventricles and sulci: Normal.  Cranium: Normal.  Paranasal sinuses: Normal.  Mastoid sinuses: Normal.   IMPRESSION:  1. Normal MRI of the orbits.  2. 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 This may be seen in multisystem atrophy, but  other etiologies are possible. Correlate clinically.    Description of dizziness: unsteadiness and whoozy Frequency: Multiple times throughout the day Duration: dizziness a few seconds but constant unsteadiness Symptom nature: intermittent, spontaneous, and positional Progression of symptoms since onset: worse, increasing frequency History of similar episodes: No  Provocative Factors: bending forward, sit to stand, turning quickly, no problems with laying down or rolling in bed Easing Factors:  waiting for symptoms to pass.  Auditory complaints (tinnitus, pain, drainage, hearing loss, aural fullness): Yes, pt reports hearing loss and occasional aural fullness but otherwise no changes; Vision changes (diplopia, visual field loss, recent changes, recent eye exam): Yes, chronic double vision intermittently when fatigued (in the last couple months) Chest pain/palpitations: No History of head injury/concussion: No Stress/anxiety: No Numbness/tingling: Denies Focal weakness:  Denies but reports progressive weakness in grip strength; Headaches/migraines: Denies  Has patient fallen in last 6 months? Yes, Number of falls: 1 Pertinent pain: No Dominant hand: right Imaging: Yes  Prior level of function: Independent for ADLs/IADLs Occupational demands: Not working Hobbies: Pt has animals on her property for which she cares  Red Flags: Positive: occasional dysphagia, weight loss (approximately 40# over the last 2-3 years, poor appetite and partially accounted by intentional weight loss), Denies: dysarthria, bowel and bladder changes, chills, fever, or night sweats;  PRECAUTIONS: None  WEIGHT BEARING RESTRICTIONS No  LIVING ENVIRONMENT: Lives with: lives with their spouse Lives in: House/apartment, 2 stories Stairs: Yes; 12-15 stairs inside railing on L during ascend Has following equipment at home: None  PATIENT GOALS: Improve balance, pt wants to be able to walk to the mailbox (gravel driveway about 0.5 miles);   OBJECTIVE  EXAMINATION  POSTURE: No gross deficits contributing to symptoms  NEUROLOGICAL SCREEN: (2+ unless otherwise noted.) N=normal  Ab=abnormal  Level Dermatome R L Myotome R L Reflex R L  C3 Anterior Neck N N Sidebend C2-3 N N Jaw CN V    C4 Top of Shoulder N N Shoulder Shrug C4 N N Hoffman's UMN    C5 Lateral Upper Arm N N Shoulder ABD C4-5 N N Biceps C5-6    C6 Lateral Arm/ Thumb N N Arm Flex/ Wrist Ext C5-6 N N Brachiorad. C5-6    C7 Middle Finger N N Arm Ext//Wrist Flex C6-7 N N Triceps  C7    C8 4th & 5th Finger N N Flex/ Ext Carpi Ulnaris C8 N N Patellar (L3-4)    T1 Medial Arm N N Interossei T1 N N Gastrocnemius    L2 Medial thigh/groin N N Illiopsoas (L2-3) N N     L3 Lower thigh/med.knee N N Quadriceps (L3-4) N N     L4 Medial leg/lat thigh N N Tibialis Ant (L4-5) N N     L5 Lat. leg & dorsal foot N N EHL (L5) N N     S1 post/lat foot/thigh/leg N N Gastrocnemius (S1-2) N N     S2 Post./med. thigh & leg N N Hamstrings (L4-S3)  N N       CRANIAL NERVES II, III, IV, VI: Pupils equal and reactive to light, visual acuity and visual fields are intact, extraocular muscles are intact  V: Facial sensation is intact and symmetric bilaterally  VII: Facial strength is intact and symmetric bilaterally  VIII: Hearing is normal as tested by gross conversation IX, X: Palate elevates midline, normal phonation, uvula midline XI: Shoulder shrug strength is intact  XII: Tongue protrudes midline    SOMATOSENSORY Grossly intact to light touch bilateral UEs/LEs as determined by testing dermatomes C2-T2 and L2-S2. Proprioception and hot/cold testing deferred on this date.   COORDINATION Finger to Nose: Dysmetria bilaterally Heel to Shin: Normal Pronator Drift: Positive on the right Rapid Alternating Movements: Normal Finger to Thumb Opposition: Normal Ataxic gait noted with wide base of support and pt leaning slightly backwards    RANGE OF MOTION Cervical Spine AROM WFL and painless in all planes. No focal deficits in AROM noted in BUE/BLE   MANUAL MUSCLE TESTING BUE/BLE strength WNL without focal deficits with the exception of 4/5 bilateral hip flexion, 4/5 bilateral ankle dorsiflexion, and decreased bilateral grip strength;   TRANSFERS/GAIT Independent for transfers and ambulation without assistive device    PATIENT SURVEYS FOTO: 50, predicted improvement to 58 ABC: To be completed, 02/17/22: 48.1%   POSTURAL CONTROL TESTS  Clinical Test of Sensory Interaction for Balance (CTSIB): Deferred  OCULOMOTOR / VESTIBULAR TESTING  Oculomotor Exam- Room Light  Findings Comments  Ocular Alignment normal   Ocular ROM normal   Spontaneous Nystagmus normal   Gaze-Holding Nystagmus normal   End-Gaze Nystagmus normal   Vergence (normal 2-3") not examined   Smooth Pursuit abnormal Saccadic  Cross-Cover Test not examined   Saccades abnormal Slow and consistently hypometric  VOR Cancellation abnormal Consistent  saccades  Left Head Impulse abnormal Corrective saccade required  Right Head Impulse normal   Static Acuity normal (02/23/22) 20/25 (line 7)  Dynamic Acuity abnormal (02/23/22) 20/70 (line 3)    Oculomotor Exam- Fixation Suppressed: Deferred   BPPV TESTS:  Symptoms Duration Intensity Nystagmus  L Dix-Hallpike None   Downbeating (does not fatigue)  R Dix-Hallpike Oscillopsia 10-15s  Downbeating (does not fatigue)  L Head Roll None   None  R Head Roll None   None  L Sidelying Test      R Sidelying Test      (blank = not tested)   Modified Clinical Test of Sensory Interaction for Balance (mCTSIB), 02/17/22:  CONDITION TIME SWAY  Eyes open, firm surface 30 seconds 2+  Eyes closed, firm surface 30 seconds 3+  Eyes open, foam surface 30 seconds 3+  Eyes closed, foam surface 6 seconds 4+    Oculomotor Exam- Fixation Suppressed (02/17/22)  Findings Comments  Ocular Alignment normal   Spontaneous Nystagmus abnormal R horizontal  beating nystagmus  Gaze-Holding Nystagmus abnormal Worsening R horizontal beating nystagmus with R gaze, Downbeating nystagmus with L gaze  End-Gaze Nystagmus abnormal See above  Head Shaking Nystagmus No change   Pressure-Induced Nystagmus No change   Hyperventilation Induced Nystagmus not examined   Skull Vibration Induced Nystagmus not examined     FUNCTIONAL OUTCOME MEASURES   02/17/22 02/21/22 Comments  BERG 51/56  Mild balance deficit  DGI     FGA 17/30  Severe dynamic instability  TUG     5TSTS 8.9s  WNL  6 Minute Walk Test     10 Meter Gait Speed     MOCA  24/30   (blank = not tested)   TODAY'S TREATMENT  SUBJECTIVE: Pt states she has not had any falls at home since last visit.  She feels like her balance is improving with PT.  She does still try to avoid stairs because of fear of falling.    PAIN: Denies  Neuromuscular Re-education   NuStep L2-4 x 5 minutes for warm-up during interval history (3 minutes unbilled);  All balance  exercises performed in // bars without UE support unless otherwise specified:  Standing step/lunge lateral R and L and posterior R and L (to simulate stepping strategy for balance control) Airex 6" alternating cone taps x 15 BLE; Alternating BOSU step-ups (round side up) x 10 leading with each LE; Airex feet together horizontal and vertical head turns x 30s each; look up/down x30 sec each Single leg balance x 30s on each LE;  Sit to stand without UE support x 10, holding 4# med ball x 10; Turn 360 deg holding object 4x ea direction   Administered BERG balance test: pt scored 52/56 Administered FOTO: pt scored 53 (was 50 at initial) Goals updated for POC/progress note    Not Performed: Tandem gait forward 15' x 4; 1/2 foam roll (round side up) static balance x 60s x2 ea Airex balance beam tandem gait forward 8' x 4; Airex balance beam side stepping 8' x 4; 1/2 foam roll (flat side up) static balance x 60s; Tandem balance alternating forward LE x 30s each; Tandem balance alternating forward LE with horizontal and vertical head turns x 30s each; Squats with glute chair taps 2 x 10; Heel/toe raises x 10 each direction; Airex balance beam tandem balance alternating forward LE x 30s each; Airex balance beam tandem balance alternating forward LE with horizontal and vertical head turns x 30s each; Airex feet together horizontal ball passes around body to therapist with ball return on opposite side at varying heights/distances, pt performing head/eye follow x multiple bouts on each side; Forward/retro gait in hallway with vertical ball lifts with head/eye follow 2 x 70' each; Forward/retro gait in hallway with horizontal ball passes between hands with head/eye follow 3 x 70'; Forward gait in hallway with ball passes from patient to therapist with head/eye follow 2 x 70' each direction; VOR x 1 horizontal/vertical standing feet apart with target at arms length on plain wall 2 x 60s with no  increase in dizziness noted today Airex feet together eyes open/closed x 30s each;    PATIENT EDUCATION:  Education details: Balance exercises, Pt educated throughout session about proper posture and technique with exercises. Improved exercise technique, movement at target joints, use of target muscles after min to mod verbal, visual, tactile cues.  Person educated: Patient Education method: Explanation  Education comprehension: verbalized understanding    HOME EXERCISE PROGRAM: Access Code: [email or web address] URL: [email or web address] Date: 03/10/2022  Prepared by: Roxana Hires  Exercises - Standing Tandem Balance with Counter Support  - 2 x daily - 7 x weekly - 3 x 30s with each foot forward hold - Standing Single Leg Stance with Counter Support  - 2 x daily - 7 x weekly - 3 x 30s on each foot hold - Tandem Walking Next to Counter  - 2 x daily - 7 x weekly - 3 x 60s hold - Standing Gaze Stabilization with Head Rotation  - 2 x daily - 7 x weekly - 3 x 60s hold - Corner Balance Feet Together With Eyes Closed  - 2 x daily - 7 x weekly - 3 x 30s hold   ASSESSMENT:  CLINICAL IMPRESSION: Pt has made progress towards her physical therapy goals as her BERG and FOTO outcome measure scores have improved.  She continues to demonstrate inconsistent foot placement and inability to always position her center of mass over base of support during dynamic balance activities.  Static balance has improved, but she does still compensate with trunk positioned posteriorly.  Overall she is very motivated to participate in PT sessions and she notes compliance with her HEP.  She does still express fear of falling.  Patient will benefit from skilled PT to improve balance, safety during dynamic activities, and overall function.  Recommend continuing with PT at this time.     REHAB POTENTIAL: Fair    CLINICAL DECISION MAKING: Evolving/moderate complexity  EVALUATION COMPLEXITY:  Moderate   GOALS: Goals reviewed with patient? No  SHORT TERM GOALS: Target date: 03/15/2022  Pt will be independent with HEP for dizziness in order to decrease symptoms, improve balance,decrease fall risk, and improve function at home. Baseline: Goal status: INITIAL   LONG TERM GOALS: Target date: 04/12/2022  Pt will increase FOTO to at least 58 to demonstrate significant improvement in function at home related to dizziness.  Baseline: 02/15/22: 50 03/17/22: 53 Goal status: INITIAL  2.  Pt will improve FGA by at least 4 points in order to demonstrate clinically significant improvement in balance and decreased risk for falls.     Baseline: 02/15/22: To be completed; 02/17/22: 17/30 Goal status: Revised  3.  Pt will improve ABC by at least 13% in order to demonstrate clinically significant improvement in balance confidence.      Baseline: 02/15/22: To be completed; 02/17/22: 48.1% Goal status: INITIAL  4. Pt will improve BERG by at least 3 points in order to demonstrate clinically significant improvement in balance and decreased risk for falls.     Baseline: 02/15/22: To be completed, 02/17/22: 51/56; 03/17/22:  Goal status: INITIAL   PLAN:  PT FREQUENCY: 1-2x/week  PT DURATION: 8 weeks  PLANNED INTERVENTIONS: Therapeutic exercises, Therapeutic activity, Neuromuscular re-education, Balance training, Gait training, Patient/Family education, Joint manipulation, Joint mobilization, Canalith repositioning, Aquatic Therapy, Dry Needling, Cognitive remediation, Electrical stimulation, Spinal manipulation, Spinal mobilization, Cryotherapy, Moist heat, Traction, Ultrasound, Ionotophoresis '4mg'$ /ml Dexamethasone, and Manual therapy  PLAN FOR NEXT SESSION:  continue adaptation, habituation, and balance exercises with patient;   Merdis Delay, PT, DPT, OCS  [phone]  Pincus Badder 03/17/2022, 11:06 AM  Merdis Delay, PT, DPT, OCS  [phone]   [facility] [address] [place], [place], [address] Phone: [phone]   Fax:  [phone]  Patient Details  Name: Kameron Glazebrook MRN: [id number] Date of Birth: 1956-06-14 Referring Provider:  Molli Knock, MD  Encounter Date: 03/17/2022   Pincus Badder, PT 03/17/2022, 11:06  AM  [facility] [address] [place], [place], [address] Phone: [phone]   Fax:  [phone]  [facility] [address] [place], [place], [address] Phone: [phone]   Fax:  [phone]  Patient Details  Name: Shamyah Stantz MRN: [id number] Date of Birth: 02-Jul-1956 Referring Provider:  Molli Knock, MD  Encounter Date: 03/17/2022   Pincus Badder, PT 03/17/2022, 11:06 AM  [place] [facility] [address] [place], [place], [address] Phone: [phone]   Fax:  [phone]

## 2022-03-21 NOTE — Therapy (Signed)
OUTPATIENT PHYSICAL THERAPY VESTIBULAR TREATMENT   Patient Name: Julie Hunter MRN: [id number] DOB:1955-08-04, 65 y.o., female Today's Date: 03/22/2022  PCP: Patient, No Pcp Per REFERRING PROVIDER: Molli Knock, MD   PT End of Session - 03/22/22 1105     Visit Number 11    Number of Visits 17    Date for PT Re-Evaluation 04/12/22    Authorization Type eval: 02/15/22    PT Start Time 1102    PT Stop Time 1145    PT Time Calculation (min) 43 min    Activity Tolerance Patient tolerated treatment well    Behavior During Therapy WFL for tasks assessed/performed               Past Medical History:  Diagnosis Date   Glaucoma    Past Surgical History:  Procedure Laterality Date   ROTATOR CUFF REPAIR Right    SHOULDER SURGERY Left    There are no problems to display for this patient.   PCP: Patient, No Pcp Per  REFERRING PROVIDER: Molli Knock, MD  REFERRING DIAGNOSIS: R42 (ICD-10-CM) - Dizziness and giddiness  THERAPY DIAG: Unsteadiness on feet  RATIONALE FOR EVALUATION AND TREATMENT: Rehabilitation  ONSET DATE: 03/10/2021 (approximate)  FOLLOW UP APPT WITH PROVIDER: No    SUBJECTIVE:   Chief Complaint:  Dizziness and unsteadiness  Pertinent History Pt referred secondary to complaints of occasional dizzy spells as well as imbalance. Symptoms first started sometime before last October and accelerated between her first and second COVID vaccination in the fall. She has noted progressive worsening of her balance as well as progressive difficulty with her short term memory. Pt now tries to hold her husbands arm when walking to facilitate balance and states that if she is holding his arm she is stable. Symptoms never occur when laying down, rolling in bed, or looking up. She denies any true vertigo. 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 Per ENT note pt had a blocked artery several years ago and was placed on baby aspirin. She was  also found to have bilateral sensorineural hearing loss and they are recommending hearing aids. Pt started noticing a blurry spot in her left eye in 2014 and while she was undergoing work-up by optho she had an MRI done which demonstrated brian atrophy, in particular the posterior fossa. She was referred to neurology and at that time she denied ever noticing any memory problems or gait difficulties. Per neurology note pt denied any family history of similar symptoms although mother did have dementia in late-life and father had what she thinks was PSP. She was followed by [place] neurology from 10/17/16 through 04/01/19. 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 Pt denies having seen neurology since 2020. According to the last neurology note there were initially concerns about a neurocognitive or neurodegenerative process. However, she has followed up for 2.5 years and she developed no abnormalities on her examination. 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 She was advised to follow up if she noticed any gait changes, falls, or more objective cognitive difficulties.   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   COMPARISON: None   TECHNIQUE/PROTOCOL: Standard orbits pre and post contrast protocol MRI  performed, including additional imaging pre and post contrast of the brain.   CONTRAST: 80m MultiHance IV. This MRI was performed before and after IV  administration of contrast material. IV contrast was administered to  improve disease detection and further  define anatomy.  *    Complications:  No immediate patient complications or events noted.   FINDINGS:  Globes: Normal.  Lenses: Normal appearing native lenses.  Optic nerves: Normal.  Intraconal fat: Normal.  Intraocular hemorrhage: Normal.  Extraocular muscles: Normal.  Intraorbital vasculature: Normal.  Lacrimal glands: Normal.   Cerebrum: No mass, mass effect, acute infarct or hemorrhage. Markedly  advanced for age global cerebral atrophy, which is disproportionately worse  within the pons and medulla.  Intracranial flow voids: Normal.  Extra-axial spaces, basal cisterns, ventricles and sulci: Normal.  Cranium: Normal.  Paranasal sinuses: Normal.  Mastoid sinuses: Normal.   IMPRESSION:  1. Normal MRI of the orbits.  2. 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 This may be seen in multisystem atrophy, but  other etiologies are possible. Correlate clinically.    Description of dizziness: unsteadiness and whoozy Frequency: Multiple times throughout the day Duration: dizziness a few seconds but constant unsteadiness Symptom nature: intermittent, spontaneous, and positional Progression of symptoms since onset: worse, increasing frequency History of similar episodes: No  Provocative Factors: bending forward, sit to stand, turning quickly, no problems with laying down or rolling in bed Easing Factors:  waiting for symptoms to pass.  Auditory complaints (tinnitus, pain, drainage, hearing loss, aural fullness): Yes, pt reports hearing loss and occasional aural fullness but otherwise no changes; Vision changes (diplopia, visual field loss, recent changes, recent eye exam): Yes, chronic double vision intermittently when fatigued (in the last couple months) Chest pain/palpitations: No History of head injury/concussion: No Stress/anxiety: No Numbness/tingling: Denies Focal weakness: Denies but reports progressive weakness  in grip strength; Headaches/migraines: Denies  Has patient fallen in last 6 months? Yes, Number of falls: 1 Pertinent pain: No Dominant hand: right Imaging: Yes  Prior level of function: Independent for ADLs/IADLs Occupational demands: Not working Hobbies: Pt has animals on her property for which she cares  Red Flags: Positive: occasional dysphagia, weight loss (approximately 40# over the last 2-3 years, poor appetite and partially accounted by intentional weight loss), Denies: dysarthria, bowel and bladder changes, chills, fever, or night sweats;  PRECAUTIONS: None  WEIGHT BEARING RESTRICTIONS No  LIVING ENVIRONMENT: Lives with: lives with their spouse Lives in: House/apartment, 2 stories Stairs: Yes; 12-15 stairs inside railing on L during ascend Has following equipment at home: None  PATIENT GOALS: Improve balance, pt wants to be able to walk to the mailbox (gravel driveway about 0.5 miles);   OBJECTIVE  EXAMINATION  POSTURE: No gross deficits contributing to symptoms  NEUROLOGICAL SCREEN: (2+ unless otherwise noted.) N=normal  Ab=abnormal  Level Dermatome R L Myotome R L Reflex R L  C3 Anterior Neck N N Sidebend C2-3 N N Jaw CN V    C4 Top of Shoulder N N Shoulder Shrug C4 N N Hoffman's UMN    C5 Lateral Upper Arm N N Shoulder ABD C4-5 N N Biceps C5-6    C6 Lateral Arm/ Thumb N N Arm Flex/ Wrist Ext C5-6 N N Brachiorad. C5-6    C7 Middle Finger N N Arm Ext//Wrist Flex C6-7 N N Triceps C7    C8  4th & 5th Finger N N Flex/ Ext Carpi Ulnaris C8 N N Patellar (L3-4)    T1 Medial Arm N N Interossei T1 N N Gastrocnemius    L2 Medial thigh/groin N N Illiopsoas (L2-3) N N     L3 Lower thigh/med.knee N N Quadriceps (L3-4) N N     L4 Medial leg/lat thigh N N Tibialis Ant (L4-5) N N     L5 Lat. leg & dorsal foot N N EHL (L5) N N     S1 post/lat foot/thigh/leg N N Gastrocnemius (S1-2) N N     S2 Post./med. thigh & leg N N Hamstrings (L4-S3) N N       CRANIAL NERVES II, III,  IV, VI: Pupils equal and reactive to light, visual acuity and visual fields are intact, extraocular muscles are intact  V: Facial sensation is intact and symmetric bilaterally  VII: Facial strength is intact and symmetric bilaterally  VIII: Hearing is normal as tested by gross conversation IX, X: Palate elevates midline, normal phonation, uvula midline XI: Shoulder shrug strength is intact  XII: Tongue protrudes midline    SOMATOSENSORY Grossly intact to light touch bilateral UEs/LEs as determined by testing dermatomes C2-T2 and L2-S2. Proprioception and hot/cold testing deferred on this date.   COORDINATION Finger to Nose: Dysmetria bilaterally Heel to Shin: Normal Pronator Drift: Positive on the right Rapid Alternating Movements: Normal Finger to Thumb Opposition: Normal Ataxic gait noted with wide base of support and pt leaning slightly backwards    RANGE OF MOTION Cervical Spine AROM WFL and painless in all planes. No focal deficits in AROM noted in BUE/BLE   MANUAL MUSCLE TESTING BUE/BLE strength WNL without focal deficits with the exception of 4/5 bilateral hip flexion, 4/5 bilateral ankle dorsiflexion, and decreased bilateral grip strength;   TRANSFERS/GAIT Independent for transfers and ambulation without assistive device    PATIENT SURVEYS FOTO: 50, predicted improvement to 58 ABC: To be completed, 02/17/22: 48.1%   POSTURAL CONTROL TESTS  Clinical Test of Sensory Interaction for Balance (CTSIB): Deferred  OCULOMOTOR / VESTIBULAR TESTING  Oculomotor Exam- Room Light  Findings Comments  Ocular Alignment normal   Ocular ROM normal   Spontaneous Nystagmus normal   Gaze-Holding Nystagmus normal   End-Gaze Nystagmus normal   Vergence (normal 2-3") not examined   Smooth Pursuit abnormal Saccadic  Cross-Cover Test not examined   Saccades abnormal Slow and consistently hypometric  VOR Cancellation abnormal Consistent saccades  Left Head Impulse abnormal  Corrective saccade required  Right Head Impulse normal   Static Acuity normal (02/23/22) 20/25 (line 7)  Dynamic Acuity abnormal (02/23/22) 20/70 (line 3)    Oculomotor Exam- Fixation Suppressed: Deferred   BPPV TESTS:  Symptoms Duration Intensity Nystagmus  L Dix-Hallpike None   Downbeating (does not fatigue)  R Dix-Hallpike Oscillopsia 10-15s  Downbeating (does not fatigue)  L Head Roll None   None  R Head Roll None   None  L Sidelying Test      R Sidelying Test      (blank = not tested)   Modified Clinical Test of Sensory Interaction for Balance (mCTSIB), 02/17/22:  CONDITION TIME SWAY  Eyes open, firm surface 30 seconds 2+  Eyes closed, firm surface 30 seconds 3+  Eyes open, foam surface 30 seconds 3+  Eyes closed, foam surface 6 seconds 4+    Oculomotor Exam- Fixation Suppressed (02/17/22)  Findings Comments  Ocular Alignment normal   Spontaneous Nystagmus abnormal R horizontal beating nystagmus  Gaze-Holding Nystagmus  abnormal Worsening R horizontal beating nystagmus with R gaze, Downbeating nystagmus with L gaze  End-Gaze Nystagmus abnormal See above  Head Shaking Nystagmus No change   Pressure-Induced Nystagmus No change   Hyperventilation Induced Nystagmus not examined   Skull Vibration Induced Nystagmus not examined     FUNCTIONAL OUTCOME MEASURES   02/17/22 02/21/22 Comments  BERG 51/56  Mild balance deficit  DGI     FGA 17/30  Severe dynamic instability  TUG     5TSTS 8.9s  WNL  6 Minute Walk Test     10 Meter Gait Speed     MOCA  24/30   (blank = not tested)   TODAY'S TREATMENT    SUBJECTIVE: Pt reports that she is doing well today. She has been diligent with her HEP. No falls since the last therapy session and no pain upon arrival today. No specific questions or concerns currently.   PAIN: Denies  Neuromuscular Re-education  NuStep L2-4 x 5 minutes for warm-up during interval history (3 minutes unbilled); All balance exercises performed in //  bars without UE support unless otherwise specified: Obstacle course with 6" and 12" hurdles x multiple laps; Forward/retro gait in hallway with vertical ball lifts with head/eye follow 2 x 70' each; Forward/retro gait in hallway with horizontal ball passes between hands with head/eye follow 2 x 70' each; Forward/retro gait in hallway with ball passes from patient to therapist with head/eye follow x 70' to both sides; Airex VOR x 1 horizontal standing feet apart with target at arms length on plain wall 3 x 60s; Airex alternating 6" step taps x 10 BLE; Tandem gait forward 15' x 4;   Not Performed: 1/2 foam roll (round side up) static balance x 60s x2 ea Airex balance beam tandem gait forward 8' x 4; Airex balance beam side stepping 8' x 4; 1/2 foam roll (flat side up) static balance x 60s; Tandem balance alternating forward LE x 30s each; Tandem balance alternating forward LE with horizontal and vertical head turns x 30s each; Squats with glute chair taps 2 x 10; Heel/toe raises x 10 each direction; Airex balance beam tandem balance alternating forward LE x 30s each; Airex balance beam tandem balance alternating forward LE with horizontal and vertical head turns x 30s each; Airex feet together horizontal ball passes around body to therapist with ball return on opposite side at varying heights/distances, pt performing head/eye follow x multiple bouts on each side; Airex feet together eyes open/closed x 30s each; Standing step/lunge lateral R and L and posterior R and L (to simulate stepping strategy for balance control) Alternating BOSU step-ups (round side up) x 10 leading with each LE; Airex feet together horizontal and vertical head turns x 30s each; look up/down x30 sec each Single leg balance x 30s on each LE;  Sit to stand without UE support x 10, holding 4# med ball x 10;    PATIENT EDUCATION:  Education details: Balance exercises;  Person educated: Patient Education method:  Explanation  Education comprehension: verbalized understanding    HOME EXERCISE PROGRAM: Access Code: [email or web address] URL: [email or web address] Date: 03/10/2022 Prepared by: Roxana Hires  Exercises - Standing Tandem Balance with Counter Support  - 2 x daily - 7 x weekly - 3 x 30s with each foot forward hold - Standing Single Leg Stance with Counter Support  - 2 x daily - 7 x weekly - 3 x 30s on each foot hold - Tandem Walking Next to Counter  - 2 x  daily - 7 x weekly - 3 x 60s hold - Standing Gaze Stabilization with Head Rotation  - 2 x daily - 7 x weekly - 3 x 60s hold - Corner Balance Feet Together With Eyes Closed  - 2 x daily - 7 x weekly - 3 x 30s hold   ASSESSMENT:  CLINICAL IMPRESSION: Continued with adaptation, habituation, and balance exercises during session today. Continued head turn/ball passes during gait in a hallway and pt is demonstrating improved stability during this activity. Progressed VOR x 1 horizontal to performing on Airex pad today. No HEP updates today. Pt encouraged to follow-up as scheduled. Patient will benefit from skilled PT to address above impairments and improve overall function.   REHAB POTENTIAL: Fair    CLINICAL DECISION MAKING: Evolving/moderate complexity  EVALUATION COMPLEXITY: Moderate   GOALS: Goals reviewed with patient? No  SHORT TERM GOALS: Target date: 03/15/2022  Pt will be independent with HEP for dizziness in order to decrease symptoms, improve balance,decrease fall risk, and improve function at home. Baseline: Goal status: INITIAL   LONG TERM GOALS: Target date: 04/12/2022  Pt will increase FOTO to at least 58 to demonstrate significant improvement in function at home related to dizziness.  Baseline: 02/15/22: 50 03/17/22: 53 Goal status: INITIAL  2.  Pt will improve FGA by at least 4 points in order to demonstrate clinically significant improvement in balance and decreased risk for falls.     Baseline: 02/15/22: To be  completed; 02/17/22: 17/30 Goal status: Revised  3.  Pt will improve ABC by at least 13% in order to demonstrate clinically significant improvement in balance confidence.      Baseline: 02/15/22: To be completed; 02/17/22: 48.1% Goal status: INITIAL  4. Pt will improve BERG by at least 3 points in order to demonstrate clinically significant improvement in balance and decreased risk for falls.     Baseline: 02/15/22: To be completed, 02/17/22: 51/56; 03/17/22: 52/56 Goal status: INITIAL   PLAN:  PT FREQUENCY: 1-2x/week  PT DURATION: 8 weeks  PLANNED INTERVENTIONS: Therapeutic exercises, Therapeutic activity, Neuromuscular re-education, Balance training, Gait training, Patient/Family education, Joint manipulation, Joint mobilization, Canalith repositioning, Aquatic Therapy, Dry Needling, Cognitive remediation, Electrical stimulation, Spinal manipulation, Spinal mobilization, Cryotherapy, Moist heat, Traction, Ultrasound, Ionotophoresis '4mg'$ /ml Dexamethasone, and Manual therapy  PLAN FOR NEXT SESSION:  continue adaptation, habituation, and balance exercises with patient;   Phillips Grout PT, DPT, GCS  Julie Hunter 03/22/2022, 12:52 PM

## 2022-03-22 ENCOUNTER — Ambulatory Visit: Payer: Medicare Other

## 2022-03-22 DIAGNOSIS — R2681 Unsteadiness on feet: Secondary | ICD-10-CM | POA: Diagnosis not present

## 2022-03-23 NOTE — Therapy (Unsigned)
OUTPATIENT PHYSICAL THERAPY VESTIBULAR TREATMENT   Patient Name: Julie Hunter MRN: [id number] DOB:[birth year], 66 y.o., female Today's Date: 03/25/2022  PCP: Patient, No Pcp Per REFERRING PROVIDER: Molli Knock, MD   PT End of Session - 03/24/22 1125     Visit Number 12    Number of Visits 17    Date for PT Re-Evaluation 04/12/22    Authorization Type eval: 02/15/22    PT Start Time 1105    PT Stop Time 1150    PT Time Calculation (min) 45 min    Activity Tolerance Patient tolerated treatment well    Behavior During Therapy WFL for tasks assessed/performed             Past Medical History:  Diagnosis Date   Glaucoma    Past Surgical History:  Procedure Laterality Date   ROTATOR CUFF REPAIR Right    SHOULDER SURGERY Left    There are no problems to display for this patient.   PCP: Patient, No Pcp Per  REFERRING PROVIDER: Molli Knock, MD  REFERRING DIAGNOSIS: R42 (ICD-10-CM) - Dizziness and giddiness  THERAPY DIAG: Unsteadiness on feet  RATIONALE FOR EVALUATION AND TREATMENT: Rehabilitation  ONSET DATE: 03/10/2021 (approximate)  FOLLOW UP APPT WITH PROVIDER: No    SUBJECTIVE:   Chief Complaint:  Dizziness and unsteadiness  Pertinent History Pt referred secondary to complaints of occasional dizzy spells as well as imbalance. Symptoms first started sometime before last October and accelerated between her first and second COVID vaccination in the fall. She has noted progressive worsening of her balance as well as progressive difficulty with her short term memory. Pt now tries to hold her husbands arm when walking to facilitate balance and states that if she is holding his arm she is stable. Symptoms never occur when laying down, rolling in bed, or looking up. She denies any true vertigo. 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 Per ENT note pt had a blocked artery several years ago and was placed on baby aspirin. She was  also found to have bilateral sensorineural hearing loss and they are recommending hearing aids. Pt started noticing a blurry spot in her left eye in 2014 and while she was undergoing work-up by optho she had an MRI done which demonstrated brian atrophy, in particular the posterior fossa. She was referred to neurology and at that time she denied ever noticing any memory problems or gait difficulties. Per neurology note pt denied any family history of similar symptoms although mother did have dementia in late-life and father had what she thinks was PSP. She was followed by [facility] from 10/17/16 through 04/01/19. 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 Pt denies having seen neurology since 2020. According to the last neurology note there were initially concerns about a neurocognitive or neurodegenerative process. However, she has followed up for 2.5 years and she developed no abnormalities on her examination. 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 She was advised to follow up if she noticed any gait changes, falls, or more objective cognitive difficulties.   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  COMPARISON: None   TECHNIQUE/PROTOCOL: Standard orbits pre and post contrast protocol MRI  performed, including additional imaging pre and post contrast of the brain.   CONTRAST: 20mL MultiHance IV. This MRI was performed before and after IV  administration of contrast material. IV contrast was administered to  improve disease detection and further  define anatomy.  *    Complications:  No immediate patient complications or events noted.   FINDINGS:  Globes: Normal.  Lenses: Normal appearing native lenses.  Optic nerves: Normal.  Intraconal fat: Normal.  Intraocular hemorrhage: Normal.  Extraocular muscles: Normal.  Intraorbital vasculature: Normal.  Lacrimal glands: Normal.   Cerebrum: No mass, mass effect, acute infarct or hemorrhage. Markedly  advanced for age global cerebral atrophy, which is disproportionately worse  within the pons and medulla.  Intracranial flow voids: Normal.  Extra-axial spaces, basal cisterns, ventricles and sulci: Normal.  Cranium: Normal.  Paranasal sinuses: Normal.  Mastoid sinuses: Normal.   IMPRESSION:  1. Normal MRI of the orbits.  2. 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 This may be seen in multisystem atrophy, but  other etiologies are possible. Correlate clinically.    Description of dizziness: unsteadiness and whoozy Frequency: Multiple times throughout the day Duration: dizziness a few seconds but constant unsteadiness Symptom nature: intermittent, spontaneous, and positional Progression of symptoms since onset: worse, increasing frequency History of similar episodes: No  Provocative Factors: bending forward, sit to stand, turning quickly, no problems with laying down or rolling in bed Easing Factors:  waiting for symptoms to pass.  Auditory complaints (tinnitus, pain, drainage, hearing loss, aural fullness): Yes, pt reports hearing loss and occasional aural fullness but otherwise no changes; Vision changes (diplopia, visual field loss, recent changes, recent eye exam): Yes, chronic double vision intermittently when fatigued (in the last couple months) Chest pain/palpitations: No History of head injury/concussion: No Stress/anxiety: No Numbness/tingling: Denies Focal weakness: Denies but reports progressive weakness  in grip strength; Headaches/migraines: Denies  Has patient fallen in last 6 months? Yes, Number of falls: 1 Pertinent pain: No Dominant hand: right Imaging: Yes  Prior level of function: Independent for ADLs/IADLs Occupational demands: Not working Hobbies: Pt has animals on her property for which she cares  Red Flags: Positive: occasional dysphagia, weight loss (approximately 40# over the last 2-3 years, poor appetite and partially accounted by intentional weight loss), Denies: dysarthria, bowel and bladder changes, chills, fever, or night sweats;  PRECAUTIONS: None  WEIGHT BEARING RESTRICTIONS No  LIVING ENVIRONMENT: Lives with: lives with their spouse Lives in: House/apartment, 2 stories Stairs: Yes; 12-15 stairs inside railing on L during ascend Has following equipment at home: None  PATIENT GOALS: Improve balance, pt wants to be able to walk to the mailbox (gravel driveway about 0.5 miles);   OBJECTIVE  EXAMINATION  POSTURE: No gross deficits contributing to symptoms  NEUROLOGICAL SCREEN: (2+ unless otherwise noted.) N=normal  Ab=abnormal  Level Dermatome R L Myotome R L Reflex R L  C3 Anterior Neck N N Sidebend C2-3 N N Jaw CN V    C4 Top of Shoulder N N Shoulder Shrug C4 N N Hoffman's UMN    C5 Lateral Upper Arm N N Shoulder ABD C4-5 N N Biceps C5-6    C6 Lateral Arm/ Thumb N N Arm Flex/ Wrist Ext C5-6 N N Brachiorad. C5-6    C7 Middle Finger N N Arm Ext//Wrist Flex C6-7 N N Triceps C7    C8 4th & 5th   Finger N N Flex/ Ext Carpi Ulnaris C8 N N Patellar (L3-4)    T1 Medial Arm N N Interossei T1 N N Gastrocnemius    L2 Medial thigh/groin N N Illiopsoas (L2-3) N N     L3 Lower thigh/med.knee N N Quadriceps (L3-4) N N     L4 Medial leg/lat thigh N N Tibialis Ant (L4-5) N N     L5 Lat. leg & dorsal foot N N EHL (L5) N N     S1 post/lat foot/thigh/leg N N Gastrocnemius (S1-2) N N     S2 Post./med. thigh & leg N N Hamstrings (L4-S3) N N       CRANIAL NERVES II, III,  IV, VI: Pupils equal and reactive to light, visual acuity and visual fields are intact, extraocular muscles are intact  V: Facial sensation is intact and symmetric bilaterally  VII: Facial strength is intact and symmetric bilaterally  VIII: Hearing is normal as tested by gross conversation IX, X: Palate elevates midline, normal phonation, uvula midline XI: Shoulder shrug strength is intact  XII: Tongue protrudes midline    SOMATOSENSORY Grossly intact to light touch bilateral UEs/LEs as determined by testing dermatomes C2-T2 and L2-S2. Proprioception and hot/cold testing deferred on this date.   COORDINATION Finger to Nose: Dysmetria bilaterally Heel to Shin: Normal Pronator Drift: Positive on the right Rapid Alternating Movements: Normal Finger to Thumb Opposition: Normal Ataxic gait noted with wide base of support and pt leaning slightly backwards    RANGE OF MOTION Cervical Spine AROM WFL and painless in all planes. No focal deficits in AROM noted in BUE/BLE   MANUAL MUSCLE TESTING BUE/BLE strength WNL without focal deficits with the exception of 4/5 bilateral hip flexion, 4/5 bilateral ankle dorsiflexion, and decreased bilateral grip strength;   TRANSFERS/GAIT Independent for transfers and ambulation without assistive device    PATIENT SURVEYS FOTO: 50, predicted improvement to 58 ABC: To be completed, 02/17/22: 48.1%   POSTURAL CONTROL TESTS  Clinical Test of Sensory Interaction for Balance (CTSIB): Deferred  OCULOMOTOR / VESTIBULAR TESTING  Oculomotor Exam- Room Light  Findings Comments  Ocular Alignment normal   Ocular ROM normal   Spontaneous Nystagmus normal   Gaze-Holding Nystagmus normal   End-Gaze Nystagmus normal   Vergence (normal 2-3") not examined   Smooth Pursuit abnormal Saccadic  Cross-Cover Test not examined   Saccades abnormal Slow and consistently hypometric  VOR Cancellation abnormal Consistent saccades  Left Head Impulse abnormal  Corrective saccade required  Right Head Impulse normal   Static Acuity normal (02/23/22) 20/25 (line 7)  Dynamic Acuity abnormal (02/23/22) 20/70 (line 3)    Oculomotor Exam- Fixation Suppressed: Deferred   BPPV TESTS:  Symptoms Duration Intensity Nystagmus  L Dix-Hallpike None   Downbeating (does not fatigue)  R Dix-Hallpike Oscillopsia 10-15s  Downbeating (does not fatigue)  L Head Roll None   None  R Head Roll None   None  L Sidelying Test      R Sidelying Test      (blank = not tested)   Modified Clinical Test of Sensory Interaction for Balance (mCTSIB), 02/17/22:  CONDITION TIME SWAY  Eyes open, firm surface 30 seconds 2+  Eyes closed, firm surface 30 seconds 3+  Eyes open, foam surface 30 seconds 3+  Eyes closed, foam surface 6 seconds 4+    Oculomotor Exam- Fixation Suppressed (02/17/22)  Findings Comments  Ocular Alignment normal   Spontaneous Nystagmus abnormal R horizontal beating nystagmus  Gaze-Holding Nystagmus abnormal Worsening R   horizontal beating nystagmus with R gaze, Downbeating nystagmus with L gaze  End-Gaze Nystagmus abnormal See above  Head Shaking Nystagmus No change   Pressure-Induced Nystagmus No change   Hyperventilation Induced Nystagmus not examined   Skull Vibration Induced Nystagmus not examined     FUNCTIONAL OUTCOME MEASURES   02/17/22 02/21/22 Comments  BERG 51/56  Mild balance deficit  DGI     FGA 17/30  Severe dynamic instability  TUG     5TSTS 8.9s  WNL  6 Minute Walk Test     10 Meter Gait Speed     MOCA  24/30   (blank = not tested)   TODAY'S TREATMENT   SUBJECTIVE: Pt reports that she is doing well today. She has been diligent with her HEP. No falls since the last therapy session and no pain upon arrival today. No specific questions or concerns currently.   PAIN: Denies  Neuromuscular Re-education  NuStep L2-3 x 5 minutes for warm-up during interval history (3 minutes unbilled); All balance exercises performed in // bars  without UE support unless otherwise specified: Tandem gait forward/backward 10' x 4; Tandem gait forward with horizontal and vertical head turns 10' x 2 each; Alternating BOSU step-ups (round side up) x 10 leading with each LE; Forward gait in hallway with vertical ball lifts with head/eye follow 2 x 70'; Forward gait in hallway with horizontal ball passes between hands with head/eye follow 2 x 70'; Forward gait in hallway with ball passes from patient to therapist with head/eye follow 2 x 70' to each side; Sit to stand without UE support and Airex pad under feet with horizontal head turns x 10; Forward gait with VOR x 1 horizontal and vertical 2 x 70' each; Airex alternating 6" step taps x 10 BLE; Heel/toe raises 3s hold x 10 each direction; Updated HEP and reviewed with patient;   Not Performed: 1/2 foam roll (round side up) static balance x 60s x2 ea 1/2 foam roll (flat side up) static balance x 60s; Airex balance beam tandem gait forward 8' x 4; Airex balance beam side stepping 8' x 4; Airex balance beam tandem balance alternating forward LE x 30s each; Airex balance beam tandem balance alternating forward LE with horizontal and vertical head turns x 30s each; Airex feet together horizontal ball passes around body to therapist with ball return on opposite side at varying heights/distances, pt performing head/eye follow x multiple bouts on each side; Airex feet together eyes open/closed x 30s each; Airex feet together horizontal and vertical head turns x 30s each Tandem balance alternating forward LE x 30s each; Tandem balance alternating forward LE with horizontal and vertical head turns x 30s each; Squats with glute chair taps 2 x 10; Standing step/lunge lateral R and L and posterior R and L (to simulate stepping strategy for balance control) Single leg balance x 30s on each LE;  Obstacle course with 6" and 12" hurdles x multiple laps;   PATIENT EDUCATION:  Education details:  Balance exercises and updated HEP  Person educated: Patient Education method: Explanation and handout Education comprehension: verbalized understanding and returned demonstration   HOME EXERCISE PROGRAM: Access Code: [email or web address] URL: [email or web address] Date: 03/25/2022 Prepared by: Roxana Hires  Exercises - Standing Tandem Balance with Counter Support  - 2 x daily - 7 x weekly - 3 x 30s with each foot forward hold - Standing Single Leg Stance with Counter Support  - 2 x daily - 7 x weekly - 3 x 30s on  each foot hold - Tandem Walking Next to Counter  - 2 x daily - 7 x weekly - 3 x 60s hold - Carioca with Counter Support  - 2 x daily - 7 x weekly - 3 x 60s hold - Heel Toe Raises with Counter Support  - 2 x daily - 7 x weekly - 2 sets - 10 reps - 3s hold - Standing Gaze Stabilization with Head Rotation  - 2 x daily - 7 x weekly - 3 x 60s hold - Corner Balance Feet Together With Eyes Closed  - 2 x daily - 7 x weekly - 3 x 30s hold   ASSESSMENT: CLINICAL IMPRESSION: Continued with adaptation, habituation, and balance exercises during session today. Continued head turn/ball passes during gait in a hallway and pt is demonstrating improved stability during this activity. Progressed VOR x 1 horizontal and vertical to ambulation in hallway. HEP updated and reviewed with patient. Pt encouraged to follow-up as scheduled. She will benefit from skilled PT to address above impairments and improve overall function.   REHAB POTENTIAL: Fair    CLINICAL DECISION MAKING: Evolving/moderate complexity  EVALUATION COMPLEXITY: Moderate   GOALS: Goals reviewed with patient? No  SHORT TERM GOALS: Target date: 03/15/2022  Pt will be independent with HEP for dizziness in order to decrease symptoms, improve balance,decrease fall risk, and improve function at home. Baseline: Goal status: INITIAL   LONG TERM GOALS: Target date: 04/12/2022  Pt will increase FOTO to at least 58 to demonstrate  significant improvement in function at home related to dizziness.  Baseline: 02/15/22: 50 03/17/22: 53 Goal status: INITIAL  2.  Pt will improve FGA by at least 4 points in order to demonstrate clinically significant improvement in balance and decreased risk for falls.     Baseline: 02/15/22: To be completed; 02/17/22: 17/30 Goal status: Revised  3.  Pt will improve ABC by at least 13% in order to demonstrate clinically significant improvement in balance confidence.      Baseline: 02/15/22: To be completed; 02/17/22: 48.1% Goal status: INITIAL  4. Pt will improve BERG by at least 3 points in order to demonstrate clinically significant improvement in balance and decreased risk for falls.     Baseline: 02/15/22: To be completed, 02/17/22: 51/56; 03/17/22: 52/56 Goal status: INITIAL   PLAN:  PT FREQUENCY: 1-2x/week  PT DURATION: 8 weeks  PLANNED INTERVENTIONS: Therapeutic exercises, Therapeutic activity, Neuromuscular re-education, Balance training, Gait training, Patient/Family education, Joint manipulation, Joint mobilization, Canalith repositioning, Aquatic Therapy, Dry Needling, Cognitive remediation, Electrical stimulation, Spinal manipulation, Spinal mobilization, Cryotherapy, Moist heat, Traction, Ultrasound, Ionotophoresis '4mg'$ /ml Dexamethasone, and Manual therapy  PLAN FOR NEXT SESSION:  continue adaptation, habituation, and balance exercises with patient;   Phillips Grout PT, DPT, GCS  Rube Sanchez 03/25/2022, 11:41 AM

## 2022-03-24 ENCOUNTER — Ambulatory Visit: Payer: Medicare Other

## 2022-03-24 DIAGNOSIS — R2681 Unsteadiness on feet: Secondary | ICD-10-CM | POA: Diagnosis not present

## 2022-03-28 NOTE — Therapy (Signed)
OUTPATIENT PHYSICAL THERAPY VESTIBULAR TREATMENT   Patient Name: Julie Hunter MRN: [id number] DOB:[birth year], 66 y.o., female Today's Date: 03/29/2022  PCP: Patient, No Pcp Per REFERRING PROVIDER: Molli Knock, MD   PT End of Session - 03/29/22 1116     Visit Number 13    Number of Visits 17    Date for PT Re-Evaluation 04/12/22    Authorization Type eval: 02/15/22    PT Start Time 1107    PT Stop Time 1145    PT Time Calculation (min) 38 min    Activity Tolerance Patient tolerated treatment well    Behavior During Therapy WFL for tasks assessed/performed            Past Medical History:  Diagnosis Date   Glaucoma    Past Surgical History:  Procedure Laterality Date   ROTATOR CUFF REPAIR Right    SHOULDER SURGERY Left    There are no problems to display for this patient.   PCP: Patient, No Pcp Per  REFERRING PROVIDER: Molli Knock, MD  REFERRING DIAGNOSIS: R42 (ICD-10-CM) - Dizziness and giddiness  THERAPY DIAG: Unsteadiness on feet  Dizziness and giddiness  RATIONALE FOR EVALUATION AND TREATMENT: Rehabilitation  ONSET DATE: 03/10/2021 (approximate)  FOLLOW UP APPT WITH PROVIDER: No    SUBJECTIVE:   Chief Complaint:  Dizziness and unsteadiness  Pertinent History Pt referred secondary to complaints of occasional dizzy spells as well as imbalance. Symptoms first started sometime before last October and accelerated between her first and second COVID vaccination in the fall. She has noted progressive worsening of her balance as well as progressive difficulty with her short term memory. Pt now tries to hold her husbands arm when walking to facilitate balance and states that if she is holding his arm she is stable. Symptoms never occur when laying down, rolling in bed, or looking up. She denies any true vertigo. 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 Per ENT note pt had a blocked artery several years ago and was placed on  baby aspirin. She was also found to have bilateral sensorineural hearing loss and they are recommending hearing aids. Pt started noticing a blurry spot in her left eye in 2014 and while she was undergoing work-up by optho she had an MRI done which demonstrated brian atrophy, in particular the posterior fossa. She was referred to neurology and at that time she denied ever noticing any memory problems or gait difficulties. Per neurology note pt denied any family history of similar symptoms although mother did have dementia in late-life and father had what she thinks was PSP. She was followed by [facility] from 10/17/16 through 04/01/19. 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 Pt denies having seen neurology since 2020. According to the last neurology note there were initially concerns about a neurocognitive or neurodegenerative process. However, she has followed up for 2.5 years and she developed no abnormalities on her examination. 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 She was advised to follow up if she noticed any gait changes, falls, or more objective cognitive difficulties.   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   COMPARISON: None   TECHNIQUE/PROTOCOL: Standard orbits pre and post contrast protocol MRI  performed, including additional imaging pre and post contrast of the brain.   CONTRAST: 43m MultiHance IV. This MRI was performed before and after IV  administration of contrast material. IV contrast was administered to  improve disease  detection and further define anatomy.  *    Complications:  No immediate patient complications or events noted.   FINDINGS:  Globes: Normal.  Lenses: Normal appearing native lenses.  Optic nerves: Normal.  Intraconal fat: Normal.  Intraocular hemorrhage: Normal.  Extraocular muscles: Normal.  Intraorbital vasculature: Normal.  Lacrimal glands: Normal.   Cerebrum: No mass, mass effect, acute infarct or hemorrhage. Markedly  advanced for age global cerebral atrophy, which is disproportionately worse  within the pons and medulla.  Intracranial flow voids: Normal.  Extra-axial spaces, basal cisterns, ventricles and sulci: Normal.  Cranium: Normal.  Paranasal sinuses: Normal.  Mastoid sinuses: Normal.   IMPRESSION:  1. Normal MRI of the orbits.  2. 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 This may be seen in multisystem atrophy, but  other etiologies are possible. Correlate clinically.    Description of dizziness: unsteadiness and whoozy Frequency: Multiple times throughout the day Duration: dizziness a few seconds but constant unsteadiness Symptom nature: intermittent, spontaneous, and positional Progression of symptoms since onset: worse, increasing frequency History of similar episodes: No  Provocative Factors: bending forward, sit to stand, turning quickly, no problems with laying down or rolling in bed Easing Factors:  waiting for symptoms to pass.  Auditory complaints (tinnitus, pain, drainage, hearing loss, aural fullness): Yes, pt reports hearing loss and occasional aural fullness but otherwise no changes; Vision changes (diplopia, visual field loss, recent changes, recent eye exam): Yes, chronic double vision intermittently when fatigued (in the last couple months) Chest pain/palpitations: No History of head injury/concussion: No Stress/anxiety: No Numbness/tingling: Denies Focal weakness: Denies but reports  progressive weakness in grip strength; Headaches/migraines: Denies  Has patient fallen in last 6 months? Yes, Number of falls: 1 Pertinent pain: No Dominant hand: right Imaging: Yes  Prior level of function: Independent for ADLs/IADLs Occupational demands: Not working Hobbies: Pt has animals on her property for which she cares  Red Flags: Positive: occasional dysphagia, weight loss (approximately 40# over the last 2-3 years, poor appetite and partially accounted by intentional weight loss), Denies: dysarthria, bowel and bladder changes, chills, fever, or night sweats;  PRECAUTIONS: None  WEIGHT BEARING RESTRICTIONS No  LIVING ENVIRONMENT: Lives with: lives with their spouse Lives in: House/apartment, 2 stories Stairs: Yes; 12-15 stairs inside railing on L during ascend Has following equipment at home: None  PATIENT GOALS: Improve balance, pt wants to be able to walk to the mailbox (gravel driveway about 0.5 miles);   OBJECTIVE  EXAMINATION  POSTURE: No gross deficits contributing to symptoms  NEUROLOGICAL SCREEN: (2+ unless otherwise noted.) N=normal  Ab=abnormal  Level Dermatome R L Myotome R L Reflex R L  C3 Anterior Neck N N Sidebend C2-3 N N Jaw CN V    C4 Top of Shoulder N N Shoulder Shrug C4 N N Hoffman's UMN    C5 Lateral Upper Arm N N Shoulder ABD C4-5 N N Biceps C5-6    C6 Lateral Arm/ Thumb N N Arm Flex/ Wrist Ext C5-6 N N Brachiorad. C5-6    C7 Middle Finger N N Arm Ext//Wrist Flex C6-7 N N Triceps C7  C8 4th & 5th Finger N N Flex/ Ext Carpi Ulnaris C8 N N Patellar (L3-4)    T1 Medial Arm N N Interossei T1 N N Gastrocnemius    L2 Medial thigh/groin N N Illiopsoas (L2-3) N N     L3 Lower thigh/med.knee N N Quadriceps (L3-4) N N     L4 Medial leg/lat thigh N N Tibialis Ant (L4-5) N N     L5 Lat. leg & dorsal foot N N EHL (L5) N N     S1 post/lat foot/thigh/leg N N Gastrocnemius (S1-2) N N     S2 Post./med. thigh & leg N N Hamstrings (L4-S3) N N        CRANIAL NERVES II, III, IV, VI: Pupils equal and reactive to light, visual acuity and visual fields are intact, extraocular muscles are intact  V: Facial sensation is intact and symmetric bilaterally  VII: Facial strength is intact and symmetric bilaterally  VIII: Hearing is normal as tested by gross conversation IX, X: Palate elevates midline, normal phonation, uvula midline XI: Shoulder shrug strength is intact  XII: Tongue protrudes midline    SOMATOSENSORY Grossly intact to light touch bilateral UEs/LEs as determined by testing dermatomes C2-T2 and L2-S2. Proprioception and hot/cold testing deferred on this date.   COORDINATION Finger to Nose: Dysmetria bilaterally Heel to Shin: Normal Pronator Drift: Positive on the right Rapid Alternating Movements: Normal Finger to Thumb Opposition: Normal Ataxic gait noted with wide base of support and pt leaning slightly backwards    RANGE OF MOTION Cervical Spine AROM WFL and painless in all planes. No focal deficits in AROM noted in BUE/BLE   MANUAL MUSCLE TESTING BUE/BLE strength WNL without focal deficits with the exception of 4/5 bilateral hip flexion, 4/5 bilateral ankle dorsiflexion, and decreased bilateral grip strength;   TRANSFERS/GAIT Independent for transfers and ambulation without assistive device    PATIENT SURVEYS FOTO: 50, predicted improvement to 58 ABC: To be completed, 02/17/22: 48.1%   POSTURAL CONTROL TESTS  Clinical Test of Sensory Interaction for Balance (CTSIB): Deferred  OCULOMOTOR / VESTIBULAR TESTING  Oculomotor Exam- Room Light  Findings Comments  Ocular Alignment normal   Ocular ROM normal   Spontaneous Nystagmus normal   Gaze-Holding Nystagmus normal   End-Gaze Nystagmus normal   Vergence (normal 2-3") not examined   Smooth Pursuit abnormal Saccadic  Cross-Cover Test not examined   Saccades abnormal Slow and consistently hypometric  VOR Cancellation abnormal Consistent saccades   Left Head Impulse abnormal Corrective saccade required  Right Head Impulse normal   Static Acuity normal (02/23/22) 20/25 (line 7)  Dynamic Acuity abnormal (02/23/22) 20/70 (line 3)    Oculomotor Exam- Fixation Suppressed: Deferred   BPPV TESTS:  Symptoms Duration Intensity Nystagmus  L Dix-Hallpike None   Downbeating (does not fatigue)  R Dix-Hallpike Oscillopsia 10-15s  Downbeating (does not fatigue)  L Head Roll None   None  R Head Roll None   None  L Sidelying Test      R Sidelying Test      (blank = not tested)   Modified Clinical Test of Sensory Interaction for Balance (mCTSIB), 02/17/22:  CONDITION TIME SWAY  Eyes open, firm surface 30 seconds 2+  Eyes closed, firm surface 30 seconds 3+  Eyes open, foam surface 30 seconds 3+  Eyes closed, foam surface 6 seconds 4+    Oculomotor Exam- Fixation Suppressed (02/17/22)  Findings Comments  Ocular Alignment normal   Spontaneous Nystagmus abnormal R horizontal beating nystagmus  Gaze-Holding  Nystagmus abnormal Worsening R horizontal beating nystagmus with R gaze, Downbeating nystagmus with L gaze  End-Gaze Nystagmus abnormal See above  Head Shaking Nystagmus No change   Pressure-Induced Nystagmus No change   Hyperventilation Induced Nystagmus not examined   Skull Vibration Induced Nystagmus not examined     FUNCTIONAL OUTCOME MEASURES   02/17/22 02/21/22 Comments  BERG 51/56  Mild balance deficit  DGI     FGA 17/30  Severe dynamic instability  TUG     5TSTS 8.9s  WNL  6 Minute Walk Test     10 Meter Gait Speed     MOCA  24/30   (blank = not tested)   TODAY'S TREATMENT   SUBJECTIVE: Pt reports that she is doing well today. She has been performing her HEP. No falls since the last therapy session and no pain upon arrival today. No specific questions or concerns currently.    PAIN: Denies   Neuromuscular Re-education  All balance exercises performed in // bars without UE support unless otherwise  specified: Tandem gait forward/backward 10' x 4 each; Tandem balance alternating forward LE x 30s each; Tandem balance alternating forward LE with horizontal and vertical head turns x 30s each; Sit to stand without UE support and Airex pad under feet with horizontal head turns x 10, vertical head turns x 10; Airex balance beam tandem gait forward/backward 8' x 4 each direction;; Airex balance beam tandem balance alternating forward LE x 30s each; Airex balance beam tandem balance alternating forward LE with horizontal and vertical head turns x 30s each; Airex balance beam side stepping 8' x 4; Airex balance beam side stepping with horizontal followed by vertical head turns 8' x 4 each; Forward/retro gait in hallway with vertical ball lifts with head/eye follow x 70' each direction; Forward/retro gait in hallway with horizontal ball passes between hands with head/eye follow x 70' each direction; 1/2 foam roll (round side up) static balance x 30s; 1/2 foam roll (round side up) balance with horizontal and vertical head turns x 30s each; 1/2 foam roll (flat side up) static balance x 30s; 1/2 foam roll (flat side up) tandem balance alternating forward LE x 30s with each foot forward; NuStep L2-3 x 5 minutes for cool down at end of session with therapist adjusting resistance to modify challenge;   Not Performed: Airex feet together eyes open/closed x 30s each; Airex feet together horizontal and vertical head turns x 30s each Squats with glute chair taps 2 x 10; Standing step/lunge lateral R and L and posterior R and L (to simulate stepping strategy for balance control) Single leg balance x 30s on each LE;  Obstacle course with 6" and 12" hurdles x multiple laps; Tandem gait forward with horizontal and vertical head turns 10' x 2 each; Alternating BOSU step-ups (round side up) x 10 leading with each LE; Forward gait in hallway with ball passes from patient to therapist with head/eye follow 2 x  70' to each side; Forward gait with VOR x 1 horizontal and vertical 2 x 70' each; Airex alternating 6" step taps x 10 BLE; Airex feet together horizontal ball passes around body to therapist with ball return on opposite side at varying heights/distances, pt performing head/eye follow x multiple bouts on each side; Heel/toe raises 3s hold x 10 each direction;   PATIENT EDUCATION:  Education details: Balance exercises Person educated: Patient Education method: Explanation, verbal cues, tactile cues Education comprehension: verbalized understanding and returned demonstration   HOME EXERCISE PROGRAM:  Access Code: [email or web address] URL: [email or web address] Date: 03/25/2022 Prepared by: Roxana Hires  Exercises - Standing Tandem Balance with Counter Support  - 2 x daily - 7 x weekly - 3 x 30s with each foot forward hold - Standing Single Leg Stance with Counter Support  - 2 x daily - 7 x weekly - 3 x 30s on each foot hold - Tandem Walking Next to Counter  - 2 x daily - 7 x weekly - 3 x 60s hold - Carioca with Counter Support  - 2 x daily - 7 x weekly - 3 x 60s hold - Heel Toe Raises with Counter Support  - 2 x daily - 7 x weekly - 2 sets - 10 reps - 3s hold - Standing Gaze Stabilization with Head Rotation  - 2 x daily - 7 x weekly - 3 x 60s hold - Corner Balance Feet Together With Eyes Closed  - 2 x daily - 7 x weekly - 3 x 30s hold   ASSESSMENT: CLINICAL IMPRESSION: Continued with adaptation, habituation, and balance exercises during session today. Continued head turn/ball passes during gait in a hallway. Additional exercises included during session today including progression of balance on 1/2 foam roll to work on ankle strategy. No HEP updates on this date. Pt encouraged to follow-up as scheduled. She will benefit from skilled PT to address above impairments and improve overall function.   REHAB POTENTIAL: Fair    CLINICAL DECISION MAKING: Evolving/moderate  complexity  EVALUATION COMPLEXITY: Moderate   GOALS: Goals reviewed with patient? No  SHORT TERM GOALS: Target date: 03/15/2022  Pt will be independent with HEP for dizziness in order to decrease symptoms, improve balance,decrease fall risk, and improve function at home. Baseline: Goal status: INITIAL   LONG TERM GOALS: Target date: 04/12/2022  Pt will increase FOTO to at least 58 to demonstrate significant improvement in function at home related to dizziness.  Baseline: 02/15/22: 50 03/17/22: 53 Goal status: INITIAL  2.  Pt will improve FGA by at least 4 points in order to demonstrate clinically significant improvement in balance and decreased risk for falls.     Baseline: 02/15/22: To be completed; 02/17/22: 17/30 Goal status: Revised  3.  Pt will improve ABC by at least 13% in order to demonstrate clinically significant improvement in balance confidence.      Baseline: 02/15/22: To be completed; 02/17/22: 48.1% Goal status: INITIAL  4. Pt will improve BERG by at least 3 points in order to demonstrate clinically significant improvement in balance and decreased risk for falls.     Baseline: 02/15/22: To be completed, 02/17/22: 51/56; 03/17/22: 52/56 Goal status: INITIAL   PLAN:  PT FREQUENCY: 1-2x/week  PT DURATION: 8 weeks  PLANNED INTERVENTIONS: Therapeutic exercises, Therapeutic activity, Neuromuscular re-education, Balance training, Gait training, Patient/Family education, Joint manipulation, Joint mobilization, Canalith repositioning, Aquatic Therapy, Dry Needling, Cognitive remediation, Electrical stimulation, Spinal manipulation, Spinal mobilization, Cryotherapy, Moist heat, Traction, Ultrasound, Ionotophoresis '4mg'$ /ml Dexamethasone, and Manual therapy  PLAN FOR NEXT SESSION:  continue adaptation, habituation, and balance exercises with patient;   Phillips Grout PT, DPT, GCS  Julie Hunter 03/29/2022, 12:58 PM

## 2022-03-29 ENCOUNTER — Ambulatory Visit: Payer: Medicare Other

## 2022-03-29 DIAGNOSIS — R2681 Unsteadiness on feet: Secondary | ICD-10-CM | POA: Diagnosis not present

## 2022-03-29 DIAGNOSIS — R42 Dizziness and giddiness: Secondary | ICD-10-CM

## 2022-03-30 NOTE — Therapy (Signed)
OUTPATIENT PHYSICAL THERAPY VESTIBULAR TREATMENT   Patient Name: Julie Hunter MRN: [id number] DOB:[birth year], 66 y.o., female Today's Date: 03/31/2022  PCP: Patient, No Pcp Per REFERRING PROVIDER: Molli Knock, MD   PT End of Session - 03/31/22 1107     Visit Number 14    Number of Visits 17    Date for PT Re-Evaluation 04/12/22    Authorization Type eval: 02/15/22    PT Start Time 1103    PT Stop Time 1147    PT Time Calculation (min) 44 min    Activity Tolerance Patient tolerated treatment well    Behavior During Therapy WFL for tasks assessed/performed             Past Medical History:  Diagnosis Date   Glaucoma    Past Surgical History:  Procedure Laterality Date   ROTATOR CUFF REPAIR Right    SHOULDER SURGERY Left    There are no problems to display for this patient.   PCP: Patient, No Pcp Per  REFERRING PROVIDER: Molli Knock, MD  REFERRING DIAGNOSIS: R42 (ICD-10-CM) - Dizziness and giddiness  THERAPY DIAG: Unsteadiness on feet  RATIONALE FOR EVALUATION AND TREATMENT: Rehabilitation  ONSET DATE: 03/10/2021 (approximate)  FOLLOW UP APPT WITH PROVIDER: No    SUBJECTIVE:   Chief Complaint:  Dizziness and unsteadiness  Pertinent History Pt referred secondary to complaints of occasional dizzy spells as well as imbalance. Symptoms first started sometime before last October and accelerated between her first and second COVID vaccination in the fall. She has noted progressive worsening of her balance as well as progressive difficulty with her short term memory. Pt now tries to hold her husbands arm when walking to facilitate balance and states that if she is holding his arm she is stable. Symptoms never occur when laying down, rolling in bed, or looking up. She denies any true vertigo. 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 Per ENT note pt had a blocked artery several years ago and was placed on baby aspirin. She was  also found to have bilateral sensorineural hearing loss and they are recommending hearing aids. Pt started noticing a blurry spot in her left eye in 2014 and while she was undergoing work-up by optho she had an MRI done which demonstrated brian atrophy, in particular the posterior fossa. She was referred to neurology and at that time she denied ever noticing any memory problems or gait difficulties. Per neurology note pt denied any family history of similar symptoms although mother did have dementia in late-life and father had what she thinks was PSP. She was followed by [facility] from 10/17/16 through 04/01/19. 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 Pt denies having seen neurology since 2020. According to the last neurology note there were initially concerns about a neurocognitive or neurodegenerative process. However, she has followed up for 2.5 years and she developed no abnormalities on her examination. 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 She was advised to follow up if she noticed any gait changes, falls, or more objective cognitive difficulties.   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  COMPARISON: None   TECHNIQUE/PROTOCOL: Standard orbits pre and post contrast protocol MRI  performed, including additional imaging pre and post contrast of the brain.   CONTRAST: 20mL MultiHance IV. This MRI was performed before and after IV  administration of contrast material. IV contrast was administered to  improve disease detection and further  define anatomy.  *    Complications:  No immediate patient complications or events noted.   FINDINGS:  Globes: Normal.  Lenses: Normal appearing native lenses.  Optic nerves: Normal.  Intraconal fat: Normal.  Intraocular hemorrhage: Normal.  Extraocular muscles: Normal.  Intraorbital vasculature: Normal.  Lacrimal glands: Normal.   Cerebrum: No mass, mass effect, acute infarct or hemorrhage. Markedly  advanced for age global cerebral atrophy, which is disproportionately worse  within the pons and medulla.  Intracranial flow voids: Normal.  Extra-axial spaces, basal cisterns, ventricles and sulci: Normal.  Cranium: Normal.  Paranasal sinuses: Normal.  Mastoid sinuses: Normal.   IMPRESSION:  1. Normal MRI of the orbits.  2. 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 This may be seen in multisystem atrophy, but  other etiologies are possible. Correlate clinically.    Description of dizziness: unsteadiness and whoozy Frequency: Multiple times throughout the day Duration: dizziness a few seconds but constant unsteadiness Symptom nature: intermittent, spontaneous, and positional Progression of symptoms since onset: worse, increasing frequency History of similar episodes: No  Provocative Factors: bending forward, sit to stand, turning quickly, no problems with laying down or rolling in bed Easing Factors:  waiting for symptoms to pass.  Auditory complaints (tinnitus, pain, drainage, hearing loss, aural fullness): Yes, pt reports hearing loss and occasional aural fullness but otherwise no changes; Vision changes (diplopia, visual field loss, recent changes, recent eye exam): Yes, chronic double vision intermittently when fatigued (in the last couple months) Chest pain/palpitations: No History of head injury/concussion: No Stress/anxiety: No Numbness/tingling: Denies Focal weakness: Denies but reports progressive weakness  in grip strength; Headaches/migraines: Denies  Has patient fallen in last 6 months? Yes, Number of falls: 1 Pertinent pain: No Dominant hand: right Imaging: Yes  Prior level of function: Independent for ADLs/IADLs Occupational demands: Not working Hobbies: Pt has animals on her property for which she cares  Red Flags: Positive: occasional dysphagia, weight loss (approximately 40# over the last 2-3 years, poor appetite and partially accounted by intentional weight loss), Denies: dysarthria, bowel and bladder changes, chills, fever, or night sweats;  PRECAUTIONS: None  WEIGHT BEARING RESTRICTIONS No  LIVING ENVIRONMENT: Lives with: lives with their spouse Lives in: House/apartment, 2 stories Stairs: Yes; 12-15 stairs inside railing on L during ascend Has following equipment at home: None  PATIENT GOALS: Improve balance, pt wants to be able to walk to the mailbox (gravel driveway about 0.5 miles);   OBJECTIVE  EXAMINATION  POSTURE: No gross deficits contributing to symptoms  NEUROLOGICAL SCREEN: (2+ unless otherwise noted.) N=normal  Ab=abnormal  Level Dermatome R L Myotome R L Reflex R L  C3 Anterior Neck N N Sidebend C2-3 N N Jaw CN V    C4 Top of Shoulder N N Shoulder Shrug C4 N N Hoffman's UMN    C5 Lateral Upper Arm N N Shoulder ABD C4-5 N N Biceps C5-6    C6 Lateral Arm/ Thumb N N Arm Flex/ Wrist Ext C5-6 N N Brachiorad. C5-6    C7 Middle Finger N N Arm Ext//Wrist Flex C6-7 N N Triceps C7    C8 4th & 5th   Finger N N Flex/ Ext Carpi Ulnaris C8 N N Patellar (L3-4)    T1 Medial Arm N N Interossei T1 N N Gastrocnemius    L2 Medial thigh/groin N N Illiopsoas (L2-3) N N     L3 Lower thigh/med.knee N N Quadriceps (L3-4) N N     L4 Medial leg/lat thigh N N Tibialis Ant (L4-5) N N     L5 Lat. leg & dorsal foot N N EHL (L5) N N     S1 post/lat foot/thigh/leg N N Gastrocnemius (S1-2) N N     S2 Post./med. thigh & leg N N Hamstrings (L4-S3) N N       CRANIAL NERVES II, III,  IV, VI: Pupils equal and reactive to light, visual acuity and visual fields are intact, extraocular muscles are intact  V: Facial sensation is intact and symmetric bilaterally  VII: Facial strength is intact and symmetric bilaterally  VIII: Hearing is normal as tested by gross conversation IX, X: Palate elevates midline, normal phonation, uvula midline XI: Shoulder shrug strength is intact  XII: Tongue protrudes midline    SOMATOSENSORY Grossly intact to light touch bilateral UEs/LEs as determined by testing dermatomes C2-T2 and L2-S2. Proprioception and hot/cold testing deferred on this date.   COORDINATION Finger to Nose: Dysmetria bilaterally Heel to Shin: Normal Pronator Drift: Positive on the right Rapid Alternating Movements: Normal Finger to Thumb Opposition: Normal Ataxic gait noted with wide base of support and pt leaning slightly backwards    RANGE OF MOTION Cervical Spine AROM WFL and painless in all planes. No focal deficits in AROM noted in BUE/BLE   MANUAL MUSCLE TESTING BUE/BLE strength WNL without focal deficits with the exception of 4/5 bilateral hip flexion, 4/5 bilateral ankle dorsiflexion, and decreased bilateral grip strength;   TRANSFERS/GAIT Independent for transfers and ambulation without assistive device    PATIENT SURVEYS FOTO: 50, predicted improvement to 58 ABC: To be completed, 02/17/22: 48.1%   POSTURAL CONTROL TESTS  Clinical Test of Sensory Interaction for Balance (CTSIB): Deferred  OCULOMOTOR / VESTIBULAR TESTING  Oculomotor Exam- Room Light  Findings Comments  Ocular Alignment normal   Ocular ROM normal   Spontaneous Nystagmus normal   Gaze-Holding Nystagmus normal   End-Gaze Nystagmus normal   Vergence (normal 2-3") not examined   Smooth Pursuit abnormal Saccadic  Cross-Cover Test not examined   Saccades abnormal Slow and consistently hypometric  VOR Cancellation abnormal Consistent saccades  Left Head Impulse abnormal  Corrective saccade required  Right Head Impulse normal   Static Acuity normal (02/23/22) 20/25 (line 7)  Dynamic Acuity abnormal (02/23/22) 20/70 (line 3)    Oculomotor Exam- Fixation Suppressed: Deferred   BPPV TESTS:  Symptoms Duration Intensity Nystagmus  L Dix-Hallpike None   Downbeating (does not fatigue)  R Dix-Hallpike Oscillopsia 10-15s  Downbeating (does not fatigue)  L Head Roll None   None  R Head Roll None   None  L Sidelying Test      R Sidelying Test      (blank = not tested)   Modified Clinical Test of Sensory Interaction for Balance (mCTSIB), 02/17/22:  CONDITION TIME SWAY  Eyes open, firm surface 30 seconds 2+  Eyes closed, firm surface 30 seconds 3+  Eyes open, foam surface 30 seconds 3+  Eyes closed, foam surface 6 seconds 4+    Oculomotor Exam- Fixation Suppressed (02/17/22)  Findings Comments  Ocular Alignment normal   Spontaneous Nystagmus abnormal R horizontal beating nystagmus  Gaze-Holding Nystagmus abnormal Worsening R   horizontal beating nystagmus with R gaze, Downbeating nystagmus with L gaze  End-Gaze Nystagmus abnormal See above  Head Shaking Nystagmus No change   Pressure-Induced Nystagmus No change   Hyperventilation Induced Nystagmus not examined   Skull Vibration Induced Nystagmus not examined     FUNCTIONAL OUTCOME MEASURES   02/17/22 02/21/22 Comments  BERG 51/56  Mild balance deficit  DGI     FGA 17/30  Severe dynamic instability  TUG     5TSTS 8.9s  WNL  6 Minute Walk Test     10 Meter Gait Speed     MOCA  24/30   (blank = not tested)    TODAY'S TREATMENT    SUBJECTIVE: Pt reports that she is doing well today. She has been performing her HEP. No falls since the last therapy session and no pain upon arrival today. No specific questions or concerns currently.    PAIN: Denies   Neuromuscular Re-education  NuStep L2-5 x 5 minutes for warm-up during interval history (3 minutes unbilled); All balance exercises performed in //  bars without UE support unless otherwise specified: Alternating 6" step taps x 10 BLE; Alternating 12" step taps x 10 BLE; Forward/retro gait across grass x 70' each; Forward/retro gait across grass with horizontal head turns x 70' each; Forward/retro gait across grass with vertical head turns x 70' each; Forward/retro gait across grass with eyes closed 2 x 70' each; Sidestepping across grass x 30' each direction; Agility ladder lateral stepping 2 x 15'; Forward/retro gait with VOR x 1 horizontal and vertical 2 x 70' each; Airex feet together eyes open/closed x 30s each; Airex feet together horizontal and vertical head turns x 30s each    Not Performed: Squats with glute chair taps 2 x 10; Standing step/lunge lateral R and L and posterior R and L (to simulate stepping strategy for balance control) Single leg balance x 30s on each LE;  Obstacle course with 6" and 12" hurdles x multiple laps; Tandem gait forward with horizontal and vertical head turns 10' x 2 each; Alternating BOSU step-ups (round side up) x 10 leading with each LE; Forward/retro gait in hallway with vertical ball lifts with head/eye follow x 70' each direction; Forward/retro gait in hallway with horizontal ball passes between hands with head/eye follow x 70' each direction; Forward gait in hallway with ball passes from patient to therapist with head/eye follow 2 x 70' to each side; Tandem gait forward/backward 10' x 4 each; Tandem balance alternating forward LE x 30s each; Tandem balance alternating forward LE with horizontal and vertical head turns x 30s each; Sit to stand without UE support and Airex pad under feet with horizontal head turns x 10, vertical head turns x 10; Airex balance beam tandem gait forward/backward 8' x 4 each direction;; Airex balance beam tandem balance alternating forward LE x 30s each; Airex balance beam tandem balance alternating forward LE with horizontal and vertical head turns x 30s  each; Airex balance beam side stepping 8' x 4; Airex balance beam side stepping with horizontal followed by vertical head turns 8' x 4 each; 1/2 foam roll (round side up) static balance x 30s; 1/2 foam roll (round side up) balance with horizontal and vertical head turns x 30s each; 1/2 foam roll (flat side up) static balance x 30s; 1/2 foam roll (flat side up) tandem balance alternating forward LE x 30s with each foot forward; Airex feet together horizontal ball passes around body to therapist with ball return on opposite  side at varying heights/distances, pt performing head/eye follow x multiple bouts on each side; Heel/toe raises 3s hold x 10 each direction;   PATIENT EDUCATION:  Education details: Balance exercises, updated HEP Person educated: Patient Education method: Explanation, verbal cues, tactile cues, and handout Education comprehension: verbalized understanding and returned demonstration   HOME EXERCISE PROGRAM: Access Code: [email or web address] URL: [email or web address] Date: 03/31/2022 Prepared by: Roxana Hires  Exercises - Standing Tandem Balance with Counter Support  - 2 x daily - 7 x weekly - 3 x 30s with each foot forward hold - Standing Single Leg Stance with Counter Support  - 2 x daily - 7 x weekly - 3 x 30s on each foot hold - Tandem Walking Next to Counter  - 2 x daily - 7 x weekly - 3 x 60s hold - Carioca with Counter Support  - 2 x daily - 7 x weekly - 3 x 60s hold - Heel Toe Raises with Counter Support  - 2 x daily - 7 x weekly - 2 sets - 10 reps - 3s hold - Standing Gaze Stabilization with Head Rotation  - 2 x daily - 7 x weekly - 3 x 60s hold - Corner Balance Feet Together With Eyes Closed  - 2 x daily - 7 x weekly - 3 x 30s hold - Corner Balance Feet Together: Eyes Closed With Head Turns  - 2 x daily - 7 x weekly - 3x 30s hold   ASSESSMENT: CLINICAL IMPRESSION: Continued with adaptation, habituation, and balance exercises during session today.  Practiced gait today outside on the uneven grass. Pt demonstrates considerable staggering when performing head turns but is able to self-correct with stepping strategy. Updated HEP during session and handout as well as education provided. Pt encouraged to follow-up as scheduled. She will benefit from skilled PT to address above impairments and improve overall function.   REHAB POTENTIAL: Fair    CLINICAL DECISION MAKING: Evolving/moderate complexity  EVALUATION COMPLEXITY: Moderate   GOALS: Goals reviewed with patient? No  SHORT TERM GOALS: Target date: 03/15/2022  Pt will be independent with HEP for dizziness in order to decrease symptoms, improve balance,decrease fall risk, and improve function at home. Baseline: Goal status: INITIAL   LONG TERM GOALS: Target date: 04/12/2022  Pt will increase FOTO to at least 58 to demonstrate significant improvement in function at home related to dizziness.  Baseline: 02/15/22: 50 03/17/22: 53 Goal status: INITIAL  2.  Pt will improve FGA by at least 4 points in order to demonstrate clinically significant improvement in balance and decreased risk for falls.     Baseline: 02/15/22: To be completed; 02/17/22: 17/30 Goal status: Revised  3.  Pt will improve ABC by at least 13% in order to demonstrate clinically significant improvement in balance confidence.      Baseline: 02/15/22: To be completed; 02/17/22: 48.1% Goal status: INITIAL  4. Pt will improve BERG by at least 3 points in order to demonstrate clinically significant improvement in balance and decreased risk for falls.     Baseline: 02/15/22: To be completed, 02/17/22: 51/56; 03/17/22: 52/56 Goal status: INITIAL   PLAN:  PT FREQUENCY: 1-2x/week  PT DURATION: 8 weeks  PLANNED INTERVENTIONS: Therapeutic exercises, Therapeutic activity, Neuromuscular re-education, Balance training, Gait training, Patient/Family education, Joint manipulation, Joint mobilization, Canalith repositioning, Aquatic  Therapy, Dry Needling, Cognitive remediation, Electrical stimulation, Spinal manipulation, Spinal mobilization, Cryotherapy, Moist heat, Traction, Ultrasound, Ionotophoresis '4mg'$ /ml Dexamethasone, and Manual therapy  PLAN FOR NEXT SESSION:  continue adaptation, habituation,  and balance exercises with patient;   Phillips Grout PT, DPT, [facility]  Elnor Renovato 03/31/2022, 1:15 PM

## 2022-03-31 ENCOUNTER — Ambulatory Visit: Payer: Medicare Other

## 2022-03-31 DIAGNOSIS — R2681 Unsteadiness on feet: Secondary | ICD-10-CM | POA: Diagnosis not present

## 2022-04-05 ENCOUNTER — Ambulatory Visit: Payer: Medicare Other

## 2022-04-05 DIAGNOSIS — R2681 Unsteadiness on feet: Secondary | ICD-10-CM | POA: Diagnosis not present

## 2022-04-05 NOTE — Therapy (Signed)
OUTPATIENT PHYSICAL THERAPY VESTIBULAR TREATMENT   Patient Name: Julie Hunter MRN: [id number] DOB:05-May-1956, 66 y.o., female Today's Date: 04/07/2022  PCP: Patient, No Pcp Per REFERRING PROVIDER: Molli Knock, MD   PT End of Session - 04/07/22 0905     Visit Number 15    Number of Visits 17    Date for PT Re-Evaluation 04/12/22    Authorization Type eval: 02/15/22    PT Start Time 1101    PT Stop Time 1145    PT Time Calculation (min) 44 min    Activity Tolerance Patient tolerated treatment well    Behavior During Therapy WFL for tasks assessed/performed             Past Medical History:  Diagnosis Date   Glaucoma    Past Surgical History:  Procedure Laterality Date   ROTATOR CUFF REPAIR Right    SHOULDER SURGERY Left    There are no problems to display for this patient.   PCP: Patient, No Pcp Per  REFERRING PROVIDER: Molli Knock, MD  REFERRING DIAGNOSIS: R42 (ICD-10-CM) - Dizziness and giddiness  THERAPY DIAG: Unsteadiness on feet  RATIONALE FOR EVALUATION AND TREATMENT: Rehabilitation  ONSET DATE: 03/10/2021 (approximate)  FOLLOW UP APPT WITH PROVIDER: No    SUBJECTIVE:   Chief Complaint:  Dizziness and unsteadiness  Pertinent History Pt referred secondary to complaints of occasional dizzy spells as well as imbalance. Symptoms first started sometime before last October and accelerated between her first and second COVID vaccination in the fall. She has noted progressive worsening of her balance as well as progressive difficulty with her short term memory. Pt now tries to hold her husbands arm when walking to facilitate balance and states that if she is holding his arm she is stable. Symptoms never occur when laying down, rolling in bed, or looking up. She denies any true vertigo. 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 Per ENT note pt had a blocked artery several years ago and was placed on baby aspirin. She was  also found to have bilateral sensorineural hearing loss and they are recommending hearing aids. Pt started noticing a blurry spot in her left eye in 2014 and while she was undergoing work-up by optho she had an MRI done which demonstrated brian atrophy, in particular the posterior fossa. She was referred to neurology and at that time she denied ever noticing any memory problems or gait difficulties. Per neurology note pt denied any family history of similar symptoms although mother did have dementia in late-life and father had what she thinks was PSP. She was followed by [facility] from 10/17/16 through 04/01/19. 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 Pt denies having seen neurology since 2020. According to the last neurology note there were initially concerns about a neurocognitive or neurodegenerative process. However, she has followed up for 2.5 years and she developed no abnormalities on her examination. 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 She was advised to follow up if she noticed any gait changes, falls, or more objective cognitive difficulties.   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  COMPARISON: None   TECHNIQUE/PROTOCOL: Standard orbits pre and post contrast protocol MRI  performed, including additional imaging pre and post contrast of the brain.   CONTRAST: 20mL MultiHance IV. This MRI was performed before and after IV  administration of contrast material. IV contrast was administered to  improve disease detection and further  define anatomy.  *    Complications:  No immediate patient complications or events noted.   FINDINGS:  Globes: Normal.  Lenses: Normal appearing native lenses.  Optic nerves: Normal.  Intraconal fat: Normal.  Intraocular hemorrhage: Normal.  Extraocular muscles: Normal.  Intraorbital vasculature: Normal.  Lacrimal glands: Normal.   Cerebrum: No mass, mass effect, acute infarct or hemorrhage. Markedly  advanced for age global cerebral atrophy, which is disproportionately worse  within the pons and medulla.  Intracranial flow voids: Normal.  Extra-axial spaces, basal cisterns, ventricles and sulci: Normal.  Cranium: Normal.  Paranasal sinuses: Normal.  Mastoid sinuses: Normal.   IMPRESSION:  1. Normal MRI of the orbits.  2. 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 This may be seen in multisystem atrophy, but  other etiologies are possible. Correlate clinically.    Description of dizziness: unsteadiness and whoozy Frequency: Multiple times throughout the day Duration: dizziness a few seconds but constant unsteadiness Symptom nature: intermittent, spontaneous, and positional Progression of symptoms since onset: worse, increasing frequency History of similar episodes: No  Provocative Factors: bending forward, sit to stand, turning quickly, no problems with laying down or rolling in bed Easing Factors:  waiting for symptoms to pass.  Auditory complaints (tinnitus, pain, drainage, hearing loss, aural fullness): Yes, pt reports hearing loss and occasional aural fullness but otherwise no changes; Vision changes (diplopia, visual field loss, recent changes, recent eye exam): Yes, chronic double vision intermittently when fatigued (in the last couple months) Chest pain/palpitations: No History of head injury/concussion: No Stress/anxiety: No Numbness/tingling: Denies Focal weakness: Denies but reports progressive weakness  in grip strength; Headaches/migraines: Denies  Has patient fallen in last 6 months? Yes, Number of falls: 1 Pertinent pain: No Dominant hand: right Imaging: Yes  Prior level of function: Independent for ADLs/IADLs Occupational demands: Not working Hobbies: Pt has animals on her property for which she cares  Red Flags: Positive: occasional dysphagia, weight loss (approximately 40# over the last 2-3 years, poor appetite and partially accounted by intentional weight loss), Denies: dysarthria, bowel and bladder changes, chills, fever, or night sweats;  PRECAUTIONS: None  WEIGHT BEARING RESTRICTIONS No  LIVING ENVIRONMENT: Lives with: lives with their spouse Lives in: House/apartment, 2 stories Stairs: Yes; 12-15 stairs inside railing on L during ascend Has following equipment at home: None  PATIENT GOALS: Improve balance, pt wants to be able to walk to the mailbox (gravel driveway about 0.5 miles);   OBJECTIVE  EXAMINATION  POSTURE: No gross deficits contributing to symptoms  NEUROLOGICAL SCREEN: (2+ unless otherwise noted.) N=normal  Ab=abnormal  Level Dermatome R L Myotome R L Reflex R L  C3 Anterior Neck N N Sidebend C2-3 N N Jaw CN V    C4 Top of Shoulder N N Shoulder Shrug C4 N N Hoffman's UMN    C5 Lateral Upper Arm N N Shoulder ABD C4-5 N N Biceps C5-6    C6 Lateral Arm/ Thumb N N Arm Flex/ Wrist Ext C5-6 N N Brachiorad. C5-6    C7 Middle Finger N N Arm Ext//Wrist Flex C6-7 N N Triceps C7    C8 4th & 5th   Finger N N Flex/ Ext Carpi Ulnaris C8 N N Patellar (L3-4)    T1 Medial Arm N N Interossei T1 N N Gastrocnemius    L2 Medial thigh/groin N N Illiopsoas (L2-3) N N     L3 Lower thigh/med.knee N N Quadriceps (L3-4) N N     L4 Medial leg/lat thigh N N Tibialis Ant (L4-5) N N     L5 Lat. leg & dorsal foot N N EHL (L5) N N     S1 post/lat foot/thigh/leg N N Gastrocnemius (S1-2) N N     S2 Post./med. thigh & leg N N Hamstrings (L4-S3) N N       CRANIAL NERVES II, III,  IV, VI: Pupils equal and reactive to light, visual acuity and visual fields are intact, extraocular muscles are intact  V: Facial sensation is intact and symmetric bilaterally  VII: Facial strength is intact and symmetric bilaterally  VIII: Hearing is normal as tested by gross conversation IX, X: Palate elevates midline, normal phonation, uvula midline XI: Shoulder shrug strength is intact  XII: Tongue protrudes midline    SOMATOSENSORY Grossly intact to light touch bilateral UEs/LEs as determined by testing dermatomes C2-T2 and L2-S2. Proprioception and hot/cold testing deferred on this date.   COORDINATION Finger to Nose: Dysmetria bilaterally Heel to Shin: Normal Pronator Drift: Positive on the right Rapid Alternating Movements: Normal Finger to Thumb Opposition: Normal Ataxic gait noted with wide base of support and pt leaning slightly backwards    RANGE OF MOTION Cervical Spine AROM WFL and painless in all planes. No focal deficits in AROM noted in BUE/BLE   MANUAL MUSCLE TESTING BUE/BLE strength WNL without focal deficits with the exception of 4/5 bilateral hip flexion, 4/5 bilateral ankle dorsiflexion, and decreased bilateral grip strength;   TRANSFERS/GAIT Independent for transfers and ambulation without assistive device    PATIENT SURVEYS FOTO: 50, predicted improvement to 58 ABC: To be completed, 02/17/22: 48.1%   POSTURAL CONTROL TESTS  Clinical Test of Sensory Interaction for Balance (CTSIB): Deferred  OCULOMOTOR / VESTIBULAR TESTING  Oculomotor Exam- Room Light  Findings Comments  Ocular Alignment normal   Ocular ROM normal   Spontaneous Nystagmus normal   Gaze-Holding Nystagmus normal   End-Gaze Nystagmus normal   Vergence (normal 2-3") not examined   Smooth Pursuit abnormal Saccadic  Cross-Cover Test not examined   Saccades abnormal Slow and consistently hypometric  VOR Cancellation abnormal Consistent saccades  Left Head Impulse abnormal  Corrective saccade required  Right Head Impulse normal   Static Acuity normal (02/23/22) 20/25 (line 7)  Dynamic Acuity abnormal (02/23/22) 20/70 (line 3)    Oculomotor Exam- Fixation Suppressed: Deferred   BPPV TESTS:  Symptoms Duration Intensity Nystagmus  L Dix-Hallpike None   Downbeating (does not fatigue)  R Dix-Hallpike Oscillopsia 10-15s  Downbeating (does not fatigue)  L Head Roll None   None  R Head Roll None   None  L Sidelying Test      R Sidelying Test      (blank = not tested)   Modified Clinical Test of Sensory Interaction for Balance (mCTSIB), 02/17/22:  CONDITION TIME SWAY  Eyes open, firm surface 30 seconds 2+  Eyes closed, firm surface 30 seconds 3+  Eyes open, foam surface 30 seconds 3+  Eyes closed, foam surface 6 seconds 4+    Oculomotor Exam- Fixation Suppressed (02/17/22)  Findings Comments  Ocular Alignment normal   Spontaneous Nystagmus abnormal R horizontal beating nystagmus  Gaze-Holding Nystagmus abnormal Worsening R   horizontal beating nystagmus with R gaze, Downbeating nystagmus with L gaze  End-Gaze Nystagmus abnormal See above  Head Shaking Nystagmus No change   Pressure-Induced Nystagmus No change   Hyperventilation Induced Nystagmus not examined   Skull Vibration Induced Nystagmus not examined     FUNCTIONAL OUTCOME MEASURES   02/17/22 02/21/22 Comments  BERG 51/56  Mild balance deficit  DGI     FGA 17/30  Severe dynamic instability  TUG     5TSTS 8.9s  WNL  6 Minute Walk Test     10 Meter Gait Speed     MOCA  24/30   (blank = not tested)    TODAY'S TREATMENT    SUBJECTIVE: Pt reports that she is doing well today. She has been performing her HEP without issue. No falls since the last therapy session and no pain upon arrival today. She complains of some R shoulder pain today from mowing yesterday. No specific questions currently.    PAIN: R shoulder pain, not rated;   Neuromuscular Re-education  NuStep L2-4 x 5 minutes for  warm-up during interval history, BLE and LUE (3 minutes unbilled); All balance exercises performed in // bars without UE support unless otherwise specified: Alternating 6" cone taps x 10 BLE; Alternating 6" cone taps on 12" step x 10 BLE; Tandem gait forward/backward 10' x 4 each; Tandem gait forward/backward 10' x 4 each with horizontal and vertical head turns; Tandem balance alternating forward LE x 30s each; Tandem balance alternating forward LE with horizontal and vertical head turns x 30s each; 1/2 foam roll (flat side up) static balance x 30s; Forward/retro gait in hallway with vertical ball lifts with head/eye follow x 70' each direction; Forward/retro gait in hallway with horizontal ball passes between hands with head/eye follow x 70' each direction; Forward gait in hallway with ball passes from patient to therapist with head/eye follow 2 x 70' to each side; Obstacle course with 6" and 12" hurdles x multiple laps; ABC: 65%   Not Performed: Squats with glute chair taps 2 x 10; Standing step/lunge lateral R and L and posterior R and L (to simulate stepping strategy for balance control) Single leg balance x 30s on each LE;  Tandem gait forward with horizontal and vertical head turns 10' x 2 each; Alternating BOSU step-ups (round side up) x 10 leading with each LE; Sit to stand without UE support and Airex pad under feet with horizontal head turns x 10, vertical head turns x 10; Airex balance beam tandem gait forward/backward 8' x 4 each direction;; Airex balance beam tandem balance alternating forward LE x 30s each; Airex balance beam tandem balance alternating forward LE with horizontal and vertical head turns x 30s each; Airex balance beam side stepping 8' x 4; Airex balance beam side stepping with horizontal followed by vertical head turns 8' x 4 each; 1/2 foam roll (round side up) static balance x 30s; 1/2 foam roll (round side up) balance with horizontal and vertical head turns  x 30s each; 1/2 foam roll (flat side up) tandem balance alternating forward LE x 30s with each foot forward; Airex feet together horizontal ball passes around body to therapist with ball return on opposite side at varying heights/distances, pt performing head/eye follow x multiple bouts on each side; Heel/toe raises 3s hold x 10 each direction; Forward/retro gait across grass x 70' each; Forward/retro gait across grass with horizontal head turns x 70' each; Forward/retro gait across grass with vertical head turns x 70' each;  Forward/retro gait across grass with eyes closed 2 x 70' each; Sidestepping across grass x 30' each direction; Agility ladder lateral stepping 2 x 15'; Forward/retro gait with VOR x 1 horizontal and vertical 2 x 70' each; Airex feet together eyes open/closed x 30s each; Airex feet together horizontal and vertical head turns x 30s each; Alternating 12" step taps x 10 BLE;   PATIENT EDUCATION:  Education details: Balance exercises, updated HEP Person educated: Patient Education method: Explanation, verbal cues, tactile cues, and handout Education comprehension: verbalized understanding and returned demonstration   HOME EXERCISE PROGRAM: Access Code: [email or web address] URL: [email or web address] Date: 03/31/2022 Prepared by: Roxana Hires  Exercises - Standing Tandem Balance with Counter Support  - 2 x daily - 7 x weekly - 3 x 30s with each foot forward hold - Standing Single Leg Stance with Counter Support  - 2 x daily - 7 x weekly - 3 x 30s on each foot hold - Tandem Walking Next to Counter  - 2 x daily - 7 x weekly - 3 x 60s hold - Carioca with Counter Support  - 2 x daily - 7 x weekly - 3 x 60s hold - Heel Toe Raises with Counter Support  - 2 x daily - 7 x weekly - 2 sets - 10 reps - 3s hold - Standing Gaze Stabilization with Head Rotation  - 2 x daily - 7 x weekly - 3 x 60s hold - Corner Balance Feet Together With Eyes Closed  - 2 x daily - 7 x weekly -  3 x 30s hold - Corner Balance Feet Together: Eyes Closed With Head Turns  - 2 x daily - 7 x weekly - 3x 30s hold   ASSESSMENT: CLINICAL IMPRESSION: Continued with adaptation, habituation, and balance exercises during session today. Overall pt is demonstrating improved stability and she notices that her confidence is also slightly higher. She completed ABC Scale and scored 65% which is improved from 48.1% when it was completed on 02/17/22. No HEP updates on this date. Plan is to continue at a frequency of once/week until she can get in to see neurology. Pt encouraged to follow-up as scheduled. She will benefit from skilled PT to address above impairments and improve overall function.   REHAB POTENTIAL: Fair    CLINICAL DECISION MAKING: Evolving/moderate complexity  EVALUATION COMPLEXITY: Moderate   GOALS: Goals reviewed with patient? No  SHORT TERM GOALS: Target date: 03/15/2022  Pt will be independent with HEP for dizziness in order to decrease symptoms, improve balance,decrease fall risk, and improve function at home. Baseline: Goal status: INITIAL   LONG TERM GOALS: Target date: 04/12/2022  Pt will increase FOTO to at least 58 to demonstrate significant improvement in function at home related to dizziness.  Baseline: 02/15/22: 50 03/17/22: 53 Goal status: INITIAL  2.  Pt will improve FGA by at least 4 points in order to demonstrate clinically significant improvement in balance and decreased risk for falls.     Baseline: 02/15/22: To be completed; 02/17/22: 17/30 Goal status: REVISED  3.  Pt will improve ABC by at least 67% in order to demonstrate clinically significant improvement in balance confidence.      Baseline: 02/15/22: To be completed; 02/17/22: 48.1%; 04/05/22: 65% Goal status: REVISED  4. Pt will improve BERG by at least 3 points in order to demonstrate clinically significant improvement in balance and decreased risk for falls.     Baseline: 02/15/22: To be completed, 02/17/22:  51/56; 03/17/22: 52/56 Goal status:  PARTIALLY MET   PLAN:  PT FREQUENCY: 1-2x/week  PT DURATION: 8 weeks  PLANNED INTERVENTIONS: Therapeutic exercises, Therapeutic activity, Neuromuscular re-education, Balance training, Gait training, Patient/Family education, Joint manipulation, Joint mobilization, Canalith repositioning, Aquatic Therapy, Dry Needling, Cognitive remediation, Electrical stimulation, Spinal manipulation, Spinal mobilization, Cryotherapy, Moist heat, Traction, Ultrasound, Ionotophoresis 4mg /ml Dexamethasone, and Manual therapy  PLAN FOR NEXT SESSION:  continue adaptation, habituation, and balance exercises with patient;   Phillips Grout PT, DPT, GCS  Julie Hunter 04/07/2022, 9:13 AM

## 2022-04-07 ENCOUNTER — Ambulatory Visit: Payer: Medicare Other

## 2022-04-07 DIAGNOSIS — R2681 Unsteadiness on feet: Secondary | ICD-10-CM | POA: Diagnosis not present

## 2022-04-07 NOTE — Therapy (Signed)
OUTPATIENT PHYSICAL THERAPY VESTIBULAR TREATMENT   Patient Name: Julie Hunter MRN: [id number] DOB:05/30/1956, 65 y.o., female Today's Date: 04/07/2022  PCP: Patient, No Pcp Per REFERRING PROVIDER: Selleck, Anne, MD   PT End of Session - 04/07/22 1052     Visit Number 16    Number of Visits 17    Date for PT Re-Evaluation 04/12/22    Authorization Type eval: 02/15/22    PT Start Time 1050    PT Stop Time 1137    PT Time Calculation (min) 47 min    Activity Tolerance Patient tolerated treatment well    Behavior During Therapy WFL for tasks assessed/performed             Past Medical History:  Diagnosis Date   Glaucoma    Past Surgical History:  Procedure Laterality Date   ROTATOR CUFF REPAIR Right    SHOULDER SURGERY Left    There are no problems to display for this patient.   PCP: Patient, No Pcp Per  REFERRING PROVIDER: Selleck, Anne, MD  REFERRING DIAGNOSIS: R42 (ICD-10-CM) - Dizziness and giddiness  THERAPY DIAG: Unsteadiness on feet  RATIONALE FOR EVALUATION AND TREATMENT: Rehabilitation  ONSET DATE: 03/10/2021 (approximate)  FOLLOW UP APPT WITH PROVIDER: No    SUBJECTIVE:   Chief Complaint:  Dizziness and unsteadiness  Pertinent History Pt referred secondary to complaints of occasional dizzy spells as well as imbalance. Symptoms first started sometime before last October and accelerated between her first and second COVID vaccination in the fall. She has noted progressive worsening of her balance as well as progressive difficulty with her short term memory. Pt now tries to hold her husbands arm when walking to facilitate balance and states that if she is holding his arm she is stable. Symptoms never occur when laying down, rolling in bed, or looking up. She denies any true vertigo. 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 Per ENT note pt had a blocked artery several years ago and was placed on baby aspirin. She was  also found to have bilateral sensorineural hearing loss and they are recommending hearing aids. Pt started noticing a blurry spot in her left eye in 2014 and while she was undergoing work-up by optho she had an MRI done which demonstrated brian atrophy, in particular the posterior fossa. She was referred to neurology and at that time she denied ever noticing any memory problems or gait difficulties. Per neurology note pt denied any family history of similar symptoms although mother did have dementia in late-life and father had what she thinks was PSP. She was followed by [facility] neurology from 10/17/16 through 04/01/19. 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 Pt denies having seen neurology since 2020. According to the last neurology note there were initially concerns about a neurocognitive or neurodegenerative process. However, she has followed up for 2.5 years and she developed no abnormalities on her examination. 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 She was advised to follow up if she noticed any gait changes, falls, or more objective cognitive difficulties.   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     COMPARISON: None   TECHNIQUE/PROTOCOL: Standard orbits pre and post contrast protocol MRI  performed, including additional imaging pre and post contrast of the brain.   CONTRAST: 20mL MultiHance IV. This MRI was performed before and after IV  administration of contrast material. IV contrast was administered to  improve disease detection and further  define anatomy.  *    Complications:  No immediate patient complications or events noted.   FINDINGS:  Globes: Normal.  Lenses: Normal appearing native lenses.  Optic nerves: Normal.  Intraconal fat: Normal.  Intraocular hemorrhage: Normal.  Extraocular muscles: Normal.  Intraorbital vasculature: Normal.  Lacrimal glands: Normal.   Cerebrum: No mass, mass effect, acute infarct or hemorrhage. Markedly  advanced for age global cerebral atrophy, which is disproportionately worse  within the pons and medulla.  Intracranial flow voids: Normal.  Extra-axial spaces, basal cisterns, ventricles and sulci: Normal.  Cranium: Normal.  Paranasal sinuses: Normal.  Mastoid sinuses: Normal.   IMPRESSION:  1. Normal MRI of the orbits.  2. 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 This may be seen in multisystem atrophy, but  other etiologies are possible. Correlate clinically.    Description of dizziness: unsteadiness and whoozy Frequency: Multiple times throughout the day Duration: dizziness a few seconds but constant unsteadiness Symptom nature: intermittent, spontaneous, and positional Progression of symptoms since onset: worse, increasing frequency History of similar episodes: No  Provocative Factors: bending forward, sit to stand, turning quickly, no problems with laying down or rolling in bed Easing Factors:  waiting for symptoms to pass.  Auditory complaints (tinnitus, pain, drainage, hearing loss, aural fullness): Yes, pt reports hearing loss and occasional aural fullness but otherwise no changes; Vision changes (diplopia, visual field loss, recent changes, recent eye exam): Yes, chronic double vision intermittently when fatigued (in the last couple months) Chest pain/palpitations: No History of head injury/concussion: No Stress/anxiety: No Numbness/tingling: Denies Focal weakness: Denies but reports progressive weakness  in grip strength; Headaches/migraines: Denies  Has patient fallen in last 6 months? Yes, Number of falls: 1 Pertinent pain: No Dominant hand: right Imaging: Yes  Prior level of function: Independent for ADLs/IADLs Occupational demands: Not working Hobbies: Pt has animals on her property for which she cares  Red Flags: Positive: occasional dysphagia, weight loss (approximately 40# over the last 2-3 years, poor appetite and partially accounted by intentional weight loss), Denies: dysarthria, bowel and bladder changes, chills, fever, or night sweats;  PRECAUTIONS: None  WEIGHT BEARING RESTRICTIONS No  LIVING ENVIRONMENT: Lives with: lives with their spouse Lives in: House/apartment, 2 stories Stairs: Yes; 12-15 stairs inside railing on L during ascend Has following equipment at home: None  PATIENT GOALS: Improve balance, pt wants to be able to walk to the mailbox (gravel driveway about 0.5 miles);   OBJECTIVE  EXAMINATION  POSTURE: No gross deficits contributing to symptoms  NEUROLOGICAL SCREEN: (2+ unless otherwise noted.) N=normal  Ab=abnormal  Level Dermatome R L Myotome R L Reflex R L  C3 Anterior Neck N N Sidebend C2-3 N N Jaw CN V    C4 Top of Shoulder N N Shoulder Shrug C4 N N Hoffman's UMN    C5 Lateral Upper Arm N N Shoulder ABD C4-5 N N Biceps C5-6    C6 Lateral Arm/ Thumb N N Arm Flex/ Wrist Ext C5-6 N N Brachiorad. C5-6    C7 Middle Finger N N Arm Ext//Wrist Flex C6-7 N N Triceps C7    C8 4th & 5th   Finger N N Flex/ Ext Carpi Ulnaris C8 N N Patellar (L3-4)    T1 Medial Arm N N Interossei T1 N N Gastrocnemius    L2 Medial thigh/groin N N Illiopsoas (L2-3) N N     L3 Lower thigh/med.knee N N Quadriceps (L3-4) N N     L4 Medial leg/lat thigh N N Tibialis Ant (L4-5) N N     L5 Lat. leg & dorsal foot N N EHL (L5) N N     S1 post/lat foot/thigh/leg N N Gastrocnemius (S1-2) N N     S2 Post./med. thigh & leg N N Hamstrings (L4-S3) N N       CRANIAL NERVES II, III,  IV, VI: Pupils equal and reactive to light, visual acuity and visual fields are intact, extraocular muscles are intact  V: Facial sensation is intact and symmetric bilaterally  VII: Facial strength is intact and symmetric bilaterally  VIII: Hearing is normal as tested by gross conversation IX, X: Palate elevates midline, normal phonation, uvula midline XI: Shoulder shrug strength is intact  XII: Tongue protrudes midline    SOMATOSENSORY Grossly intact to light touch bilateral UEs/LEs as determined by testing dermatomes C2-T2 and L2-S2. Proprioception and hot/cold testing deferred on this date.   COORDINATION Finger to Nose: Dysmetria bilaterally Heel to Shin: Normal Pronator Drift: Positive on the right Rapid Alternating Movements: Normal Finger to Thumb Opposition: Normal Ataxic gait noted with wide base of support and pt leaning slightly backwards    RANGE OF MOTION Cervical Spine AROM WFL and painless in all planes. No focal deficits in AROM noted in BUE/BLE   MANUAL MUSCLE TESTING BUE/BLE strength WNL without focal deficits with the exception of 4/5 bilateral hip flexion, 4/5 bilateral ankle dorsiflexion, and decreased bilateral grip strength;   TRANSFERS/GAIT Independent for transfers and ambulation without assistive device    PATIENT SURVEYS FOTO: 50, predicted improvement to 58 ABC: To be completed, 02/17/22: 48.1%   POSTURAL CONTROL TESTS  Clinical Test of Sensory Interaction for Balance (CTSIB): Deferred  OCULOMOTOR / VESTIBULAR TESTING  Oculomotor Exam- Room Light  Findings Comments  Ocular Alignment normal   Ocular ROM normal   Spontaneous Nystagmus normal   Gaze-Holding Nystagmus normal   End-Gaze Nystagmus normal   Vergence (normal 2-3") not examined   Smooth Pursuit abnormal Saccadic  Cross-Cover Test not examined   Saccades abnormal Slow and consistently hypometric  VOR Cancellation abnormal Consistent saccades  Left Head Impulse abnormal  Corrective saccade required  Right Head Impulse normal   Static Acuity normal (02/23/22) 20/25 (line 7)  Dynamic Acuity abnormal (02/23/22) 20/70 (line 3)    Oculomotor Exam- Fixation Suppressed: Deferred   BPPV TESTS:  Symptoms Duration Intensity Nystagmus  L Dix-Hallpike None   Downbeating (does not fatigue)  R Dix-Hallpike Oscillopsia 10-15s  Downbeating (does not fatigue)  L Head Roll None   None  R Head Roll None   None  L Sidelying Test      R Sidelying Test      (blank = not tested)   Modified Clinical Test of Sensory Interaction for Balance (mCTSIB), 02/17/22:  CONDITION TIME SWAY  Eyes open, firm surface 30 seconds 2+  Eyes closed, firm surface 30 seconds 3+  Eyes open, foam surface 30 seconds 3+  Eyes closed, foam surface 6 seconds 4+    Oculomotor Exam- Fixation Suppressed (02/17/22)  Findings Comments  Ocular Alignment normal   Spontaneous Nystagmus abnormal R horizontal beating nystagmus  Gaze-Holding Nystagmus abnormal Worsening R   horizontal beating nystagmus with R gaze, Downbeating nystagmus with L gaze  End-Gaze Nystagmus abnormal See above  Head Shaking Nystagmus No change   Pressure-Induced Nystagmus No change   Hyperventilation Induced Nystagmus not examined   Skull Vibration Induced Nystagmus not examined     FUNCTIONAL OUTCOME MEASURES   02/17/22 02/21/22 Comments  BERG 51/56  Mild balance deficit  DGI     FGA 17/30  Severe dynamic instability  TUG     5TSTS 8.9s  WNL  6 Minute Walk Test     10 Meter Gait Speed     MOCA  24/30   (blank = not tested)    TODAY'S TREATMENT    SUBJECTIVE: Pt reports that she is doing well today. She has been performing her HEP without issue. No falls since the last therapy session but she continues to experience stumbles. No pain upon arrival today. R shoulder soreness has resolved. No specific questions currently.    PAIN: Denies   Neuromuscular Re-education  NuStep L2-4 x 5 minutes for warm-up during  interval history, BLE and LUE (3 minutes unbilled); All balance exercises performed in // bars without UE support unless otherwise specified: Tandem gait forward/backward 10' x 4 each; Tandem balance alternating forward LE x 30s each; Tandem balance alternating forward LE with horizontal and vertical head turns x 30s each; Forward/retro gait across grass with vertical ball lift and head/eye follow x 70' each; Forward/retro gait across grass with horizontal ball bass between hands and head/eye follow x 70' each; Forward/retro gait across grass with horizontal ball pass to therapist with head/eye follow x 70' each toward each side;; Forward gait across grass with eyes closed x 50', very difficult for patient; Sidestepping across grass x 30' each direction; Cross-over stepping across grass x 30' each direction; Forward/retro gait with VOR x 1 horizontal and vertical x 70' each for both directions; 1/2 foam roll (flat side up) static balance x 60s; 1/2 foam roll (flat side up) tandem balance alternating forward LE x 60s with each foot forward; Forward step lunges x 5 BLE;  HEP updated and reviewed;   Not Performed: Squats with glute chair taps 2 x 10; Sit to stand without UE support and Airex pad under feet with horizontal head turns x 10, vertical head turns x 10; Single leg balance x 30s on each LE;  Tandem gait forward with horizontal and vertical head turns 10' x 2 each; Alternating BOSU step-ups (round side up) x 10 leading with each LE; Airex feet together eyes open/closed x 30s each; Airex feet together horizontal and vertical head turns x 30s each; Airex balance beam tandem gait forward/backward 8' x 4 each direction;; Airex balance beam tandem balance alternating forward LE x 30s each; Airex balance beam tandem balance alternating forward LE with horizontal and vertical head turns x 30s each; Airex balance beam side stepping 8' x 4; Airex balance beam side stepping with horizontal  followed by vertical head turns 8' x 4 each; Airex feet together horizontal ball passes around body to therapist with ball return on opposite side at varying heights/distances, pt performing head/eye follow x multiple bouts on each side; Heel/toe raises 3s hold x 10 each direction; Agility ladder lateral stepping 2 x 15'; Alternating 12" step taps x 10 BLE; Alternating 6" cone taps x 10 BLE; Alternating 6" cone taps on 12" step x 10 BLE; Forward/retro gait in hallway with vertical ball lifts with head/eye follow x 70' each direction; Forward/retro gait in hallway   with horizontal ball passes between hands with head/eye follow x 70' each direction; Forward gait in hallway with ball passes from patient to therapist with head/eye follow 2 x 70' to each side; Obstacle course with 6" and 12" hurdles x multiple laps;   PATIENT EDUCATION:  Education details: Balance exercises, updated HEP Person educated: Patient Education method: Explanation, verbal cues, tactile cues, and handout Education comprehension: verbalized understanding and returned demonstration   HOME EXERCISE PROGRAM: Access Code: [email or web address] URL: [email or web address] Date: 04/07/2022 Prepared by: Jason Huprich  Exercises - Standing Tandem Balance with Counter Support  - 2 x daily - 7 x weekly - 3 x 30s with each foot forward hold - Standing Single Leg Stance with Counter Support  - 2 x daily - 7 x weekly - 3 x 30s on each foot hold - Tandem Walking Next to Counter  - 2 x daily - 7 x weekly - 3 x 60s hold - Carioca with Counter Support  - 2 x daily - 7 x weekly - 3 x 60s hold - Heel Toe Raises with Counter Support  - 2 x daily - 7 x weekly - 2 sets - 10 reps - 3s hold - Lunge with Counter Support  - 2 x daily - 7 x weekly - 2 sets - 10 reps - Standing Gaze Stabilization with Head Rotation  - 2 x daily - 7 x weekly - 3 x 60s hold - Corner Balance Feet Together With Eyes Closed  - 2 x daily - 7 x weekly - 3 x 30s  hold - Corner Balance Feet Together: Eyes Closed With Head Turns  - 2 x daily - 7 x weekly - 3x 30s hold   ASSESSMENT: CLINICAL IMPRESSION: Continued with adaptation, habituation, and balance exercises during session today. Overall pt is demonstrating improved stability however she does struggle significantly when walking outside across the uneven grass with her eyes closed. HEP updated. Plan is to continue at a frequency of once/week until she can get in to see neurology. Pt encouraged to follow-up as scheduled. She will benefit from skilled PT to address above impairments and improve overall function.   REHAB POTENTIAL: Fair    CLINICAL DECISION MAKING: Evolving/moderate complexity  EVALUATION COMPLEXITY: Moderate   GOALS: Goals reviewed with patient? No  SHORT TERM GOALS: Target date: 03/15/2022  Pt will be independent with HEP for dizziness in order to decrease symptoms, improve balance,decrease fall risk, and improve function at home. Baseline: Goal status: INITIAL   LONG TERM GOALS: Target date: 04/12/2022  Pt will increase FOTO to at least 58 to demonstrate significant improvement in function at home related to dizziness.  Baseline: 02/15/22: 50 03/17/22: 53 Goal status: INITIAL  2.  Pt will improve FGA by at least 4 points in order to demonstrate clinically significant improvement in balance and decreased risk for falls.     Baseline: 02/15/22: To be completed; 02/17/22: 17/30 Goal status: REVISED  3.  Pt will improve ABC by at least 67% in order to demonstrate clinically significant improvement in balance confidence.      Baseline: 02/15/22: To be completed; 02/17/22: 48.1%; 04/05/22: 65% Goal status: REVISED  4. Pt will improve BERG by at least 3 points in order to demonstrate clinically significant improvement in balance and decreased risk for falls.     Baseline: 02/15/22: To be completed, 02/17/22: 51/56; 03/17/22: 52/56 Goal status: PARTIALLY MET   PLAN:  PT FREQUENCY:  1-2x/week  PT DURATION: 8 weeks  PLANNED INTERVENTIONS:   Therapeutic exercises, Therapeutic activity, Neuromuscular re-education, Balance training, Gait training, Patient/Family education, Joint manipulation, Joint mobilization, Canalith repositioning, Aquatic Therapy, Dry Needling, Cognitive remediation, Electrical stimulation, Spinal manipulation, Spinal mobilization, Cryotherapy, Moist heat, Traction, Ultrasound, Ionotophoresis 22m/ml Dexamethasone, and Manual therapy  PLAN FOR NEXT SESSION:  Update outcome measures/goals, recertification, continue adaptation, habituation, and balance exercises with patient;   JPhillips GroutPT, DPT, GCS  Julie Hunter 04/07/2022, 12:30 PM

## 2022-04-13 ENCOUNTER — Ambulatory Visit: Payer: Medicare Other | Attending: Otolaryngology

## 2022-04-13 DIAGNOSIS — R2681 Unsteadiness on feet: Secondary | ICD-10-CM | POA: Insufficient documentation

## 2022-04-13 DIAGNOSIS — R42 Dizziness and giddiness: Secondary | ICD-10-CM | POA: Insufficient documentation

## 2022-04-13 NOTE — Therapy (Signed)
OUTPATIENT PHYSICAL THERAPY VESTIBULAR TREATMENT/RECERTIFICATION  Patient Name: Julie Hunter MRN: [id number] DOB:06-29-1956, 66 y.o., female Today's Date: 04/13/2022  PCP: Patient, No Pcp Per REFERRING PROVIDER: Molli Knock, MD   PT End of Session - 04/13/22 1312     Visit Number 17    Number of Visits 33    Date for PT Re-Evaluation 06/08/22    Authorization Type eval: 02/15/22    PT Start Time 1315    PT Stop Time 1400    PT Time Calculation (min) 45 min    Activity Tolerance Patient tolerated treatment well    Behavior During Therapy WFL for tasks assessed/performed            Past Medical History:  Diagnosis Date   Glaucoma    Past Surgical History:  Procedure Laterality Date   ROTATOR CUFF REPAIR Right    SHOULDER SURGERY Left    There are no problems to display for this patient.  PCP: Patient, No Pcp Per  REFERRING PROVIDER: Molli Knock, MD  REFERRING DIAGNOSIS: R42 (ICD-10-CM) - Dizziness and giddiness  THERAPY DIAG: Unsteadiness on feet  Dizziness and giddiness  RATIONALE FOR EVALUATION AND TREATMENT: Rehabilitation  ONSET DATE: 03/10/2021 (approximate)  FOLLOW UP APPT WITH PROVIDER: No   FROM INITIAL EVALUATION SUBJECTIVE:   Chief Complaint:  Dizziness and unsteadiness  Pertinent History Pt referred secondary to complaints of occasional dizzy spells as well as imbalance. Symptoms first started sometime before last October and accelerated between her first and second COVID vaccination in the fall. She has noted progressive worsening of her balance as well as progressive difficulty with her short term memory. Pt now tries to hold her husbands arm when walking to facilitate balance and states that if she is holding his arm she is stable. Symptoms never occur when laying down, rolling in bed, or looking up. She denies any true vertigo. 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 Per ENT note pt had a blocked artery  several years ago and was placed on baby aspirin. She was also found to have bilateral sensorineural hearing loss and they are recommending hearing aids. Pt started noticing a blurry spot in her left eye in 2014 and while she was undergoing work-up by optho she had an MRI done which demonstrated brian atrophy, in particular the posterior fossa. She was referred to neurology and at that time she denied ever noticing any memory problems or gait difficulties. Per neurology note pt denied any family history of similar symptoms although mother did have dementia in late-life and father had what she thinks was PSP. She was followed by [facility] from 10/17/16 through 04/01/19. 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 Pt denies having seen neurology since 2020. According to the last neurology note there were initially concerns about a neurocognitive or neurodegenerative process. However, she has followed up for 2.5 years and she developed no abnormalities on her examination. 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 She was advised to follow up if she noticed any gait changes, falls, or more objective cognitive difficulties.   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   COMPARISON: None   TECHNIQUE/PROTOCOL: Standard orbits pre and post contrast protocol MRI  performed, including additional imaging pre and post contrast of the brain.   CONTRAST: 54m MultiHance IV. This MRI was performed before and after IV  administration of contrast material. IV contrast was  administered to  improve disease detection and further define anatomy.  *    Complications:  No immediate patient complications or events noted.   FINDINGS:  Globes: Normal.  Lenses: Normal appearing native lenses.  Optic nerves: Normal.  Intraconal fat: Normal.  Intraocular hemorrhage: Normal.  Extraocular muscles: Normal.  Intraorbital vasculature: Normal.  Lacrimal glands: Normal.   Cerebrum: No mass, mass effect, acute infarct or hemorrhage. Markedly  advanced for age global cerebral atrophy, which is disproportionately worse  within the pons and medulla.  Intracranial flow voids: Normal.  Extra-axial spaces, basal cisterns, ventricles and sulci: Normal.  Cranium: Normal.  Paranasal sinuses: Normal.  Mastoid sinuses: Normal.   IMPRESSION:  1. Normal MRI of the orbits.  2. 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 This may be seen in multisystem atrophy, but  other etiologies are possible. Correlate clinically.   Description of dizziness: unsteadiness and whoozy Frequency: Multiple times throughout the day Duration: dizziness a few seconds but constant unsteadiness Symptom nature: intermittent, spontaneous, and positional Progression of symptoms since onset: worse, increasing frequency History of similar episodes: No  Provocative Factors: bending forward, sit to stand, turning quickly, no problems with laying down or rolling in bed Easing Factors:  waiting for symptoms to pass.  Auditory complaints (tinnitus, pain, drainage, hearing loss, aural fullness): Yes, pt reports hearing loss and occasional aural fullness but otherwise no changes; Vision changes (diplopia, visual field loss, recent changes, recent eye exam): Yes, chronic double vision intermittently when fatigued (in the last couple months) Chest pain/palpitations: No History of head injury/concussion: No Stress/anxiety: No Numbness/tingling:  Denies Focal weakness: Denies but reports progressive weakness in grip strength; Headaches/migraines: Denies  Has patient fallen in last 6 months? Yes, Number of falls: 1 Pertinent pain: No Dominant hand: right Imaging: Yes  Prior level of function: Independent for ADLs/IADLs Occupational demands: Not working Hobbies: Pt has animals on her property for which she cares  Red Flags: Positive: occasional dysphagia, weight loss (approximately 40# over the last 2-3 years, poor appetite and partially accounted by intentional weight loss), Denies: dysarthria, bowel and bladder changes, chills, fever, or night sweats;  PRECAUTIONS: None  WEIGHT BEARING RESTRICTIONS No  LIVING ENVIRONMENT: Lives with: lives with their spouse Lives in: House/apartment, 2 stories Stairs: Yes; 12-15 stairs inside railing on L during ascend Has following equipment at home: None  PATIENT GOALS: Improve balance, pt wants to be able to walk to the mailbox (gravel driveway about 0.5 miles);   OBJECTIVE EXAMINATION  POSTURE: No gross deficits contributing to symptoms  NEUROLOGICAL SCREEN: (2+ unless otherwise noted.) N=normal  Ab=abnormal  Level Dermatome R L Myotome R L Reflex R L  C3 Anterior Neck N N Sidebend C2-3 N N Jaw CN V    C4 Top of Shoulder N N Shoulder Shrug C4 N N Hoffman's UMN    C5 Lateral Upper Arm N N Shoulder ABD C4-5 N N Biceps C5-6    C6 Lateral Arm/ Thumb N N Arm Flex/ Wrist Ext C5-6 N N Brachiorad. C5-6    C7 Middle Finger N N Arm Ext//Wrist Flex C6-7 N N Triceps C7    C8  4th & 5th Finger N N Flex/ Ext Carpi Ulnaris C8 N N Patellar (L3-4)    T1 Medial Arm N N Interossei T1 N N Gastrocnemius    L2 Medial thigh/groin N N Illiopsoas (L2-3) N N     L3 Lower thigh/med.knee N N Quadriceps (L3-4) N N     L4 Medial leg/lat thigh N N Tibialis Ant (L4-5) N N     L5 Lat. leg & dorsal foot N N EHL (L5) N N     S1 post/lat foot/thigh/leg N N Gastrocnemius (S1-2) N N     S2 Post./med. thigh & leg  N N Hamstrings (L4-S3) N N      CRANIAL NERVES II, III, IV, VI: Pupils equal and reactive to light, visual acuity and visual fields are intact, extraocular muscles are intact  V: Facial sensation is intact and symmetric bilaterally  VII: Facial strength is intact and symmetric bilaterally  VIII: Hearing is normal as tested by gross conversation IX, X: Palate elevates midline, normal phonation, uvula midline XI: Shoulder shrug strength is intact  XII: Tongue protrudes midline   SOMATOSENSORY Grossly intact to light touch bilateral UEs/LEs as determined by testing dermatomes C2-T2 and L2-S2. Proprioception and hot/cold testing deferred on this date.  COORDINATION Finger to Nose: Dysmetria bilaterally Heel to Shin: Normal Pronator Drift: Positive on the right Rapid Alternating Movements: Normal Finger to Thumb Opposition: Normal Ataxic gait noted with wide base of support and pt leaning slightly backwards   RANGE OF MOTION Cervical Spine AROM WFL and painless in all planes. No focal deficits in AROM noted in BUE/BLE  MANUAL MUSCLE TESTING BUE/BLE strength WNL without focal deficits with the exception of 4/5 bilateral hip flexion, 4/5 bilateral ankle dorsiflexion, and decreased bilateral grip strength;  TRANSFERS/GAIT Independent for transfers and ambulation without assistive device   PATIENT SURVEYS FOTO: 50, predicted improvement to 58 ABC: To be completed, 02/17/22: 48.1%  POSTURAL CONTROL TESTS  Clinical Test of Sensory Interaction for Balance (CTSIB): Deferred  OCULOMOTOR / VESTIBULAR TESTING  Oculomotor Exam- Room Light  Findings Comments  Ocular Alignment normal   Ocular ROM normal   Spontaneous Nystagmus normal   Gaze-Holding Nystagmus normal   End-Gaze Nystagmus normal   Vergence (normal 2-3") not examined   Smooth Pursuit abnormal Saccadic  Cross-Cover Test not examined   Saccades abnormal Slow and consistently hypometric  VOR Cancellation abnormal Consistent  saccades  Left Head Impulse abnormal Corrective saccade required  Right Head Impulse normal   Static Acuity normal (02/23/22) 20/25 (line 7)  Dynamic Acuity abnormal (02/23/22) 20/70 (line 3)   Oculomotor Exam- Fixation Suppressed: Deferred  BPPV TESTS:  Symptoms Duration Intensity Nystagmus  L Dix-Hallpike None   Downbeating (does not fatigue)  R Dix-Hallpike Oscillopsia 10-15s  Downbeating (does not fatigue)  L Head Roll None   None  R Head Roll None   None  L Sidelying Test      R Sidelying Test      (blank = not tested)  Modified Clinical Test of Sensory Interaction for Balance (mCTSIB), 02/17/22:  CONDITION TIME SWAY  Eyes open, firm surface 30 seconds 2+  Eyes closed, firm surface 30 seconds 3+  Eyes open, foam surface 30 seconds 3+  Eyes closed, foam surface 6 seconds 4+   Oculomotor Exam- Fixation Suppressed (02/17/22)  Findings Comments  Ocular Alignment normal   Spontaneous Nystagmus abnormal R horizontal beating nystagmus  Gaze-Holding Nystagmus abnormal Worsening R horizontal beating nystagmus with R gaze, Downbeating nystagmus with  L gaze  End-Gaze Nystagmus abnormal See above  Head Shaking Nystagmus No change   Pressure-Induced Nystagmus No change   Hyperventilation Induced Nystagmus not examined   Skull Vibration Induced Nystagmus not examined    FUNCTIONAL OUTCOME MEASURES   02/17/22 02/21/22 Comments  BERG 51/56  Mild balance deficit  DGI     FGA 17/30  Severe dynamic instability  TUG     5TSTS 8.9s  WNL  6 Minute Walk Test     10 Meter Gait Speed     MOCA  24/30   (blank = not tested)    TODAY'S TREATMENT    SUBJECTIVE: Pt reports that she is doing well today. She has been performing her HEP without issue. No falls since the last therapy session. She reports feeling considerable improvement in her balance since starting with therapy. No pain upon arrival today. No specific questions currently.    PAIN: Denies   Neuromuscular Re-education   NuStep L2-5 x 5 minutes for warm-up during interval history, BLE and LUE (3 minutes unbilled);  Updated outcome measures: BERG: 52/56 FGA: 24/30 FOTO: 53  Forward gait in // bars stepping over 6" obstacles x multiple bouts; Lateral gait in // bars stepping over 6" obstacles x multiple bouts; Tandem gait in // bars stepping over 6" obstacles x multiple bouts;   Not Performed: Squats with glute chair taps 2 x 10; Sit to stand without UE support and Airex pad under feet with horizontal head turns x 10, vertical head turns x 10; Single leg balance x 30s on each LE;  Tandem gait forward with horizontal and vertical head turns 10' x 2 each; Tandem gait forward/backward 10' x 4 each; Tandem balance alternating forward LE x 30s each; Alternating BOSU step-ups (round side up) x 10 leading with each LE; Airex feet together eyes open/closed x 30s each; Airex feet together horizontal and vertical head turns x 30s each; Airex balance beam tandem gait forward/backward 8' x 4 each direction;; Airex balance beam tandem balance alternating forward LE x 30s each; Airex balance beam tandem balance alternating forward LE with horizontal and vertical head turns x 30s each; Airex balance beam side stepping 8' x 4; Airex balance beam side stepping with horizontal followed by vertical head turns 8' x 4 each; Airex feet together horizontal ball passes around body to therapist with ball return on opposite side at varying heights/distances, pt performing head/eye follow x multiple bouts on each side; Heel/toe raises 3s hold x 10 each direction; Agility ladder lateral stepping 2 x 15'; Alternating 12" step taps x 10 BLE; Alternating 6" cone taps x 10 BLE; Alternating 6" cone taps on 12" step x 10 BLE; Forward/retro gait in hallway with vertical ball lifts with head/eye follow x 70' each direction; Forward/retro gait in hallway with horizontal ball passes between hands with head/eye follow x 70' each  direction; Forward gait in hallway with ball passes from patient to therapist with head/eye follow 2 x 70' to each side; Forward/retro gait with VOR x 1 horizontal and vertical x 70' each for both directions; 1/2 foam roll (flat side up) static balance x 60s; 1/2 foam roll (flat side up) tandem balance alternating forward LE x 60s with each foot forward; Forward step lunges x 5 BLE;    PATIENT EDUCATION:  Education details: Balance exercises, updated HEP Person educated: Patient Education method: Explanation, verbal cues, tactile cues, and handout Education comprehension: verbalized understanding and returned demonstration   HOME EXERCISE PROGRAM: Access Code:  [email or web address] URL: [email or web address] Date: 04/13/2022 Prepared by: Roxana Hires  Exercises - Standing Tandem Balance with Counter Support  - 2 x daily - 7 x weekly - 1 reps - 60 hold - Standing Tandem Balance with Counter Support (Mirrored)  - 2 x daily - 7 x weekly - 1 reps - 60 hold - Standing Single Leg Stance with Counter Support  - 2 x daily - 7 x weekly - 1 reps - 60 hold - Standing Single Leg Stance with Counter Support (Mirrored)  - 2 x daily - 7 x weekly - 1 reps - 60 hold - Corner Balance Feet Together With Eyes Closed  - 2 x daily - 7 x weekly - 1 reps - 60s hold - Corner Balance Feet Together: Eyes Closed With Head Turns  - 2 x daily - 7 x weekly - 1 reps - 60 hold - Standing Gaze Stabilization with Head Rotation  - 2 x daily - 7 x weekly - 3 reps - 60s hold - Tandem Walking Next to Counter  - 2 x daily - 7 x weekly - 1 reps - 60s hold - Carioca with Counter Support  - 2 x daily - 7 x weekly - 1 reps - 60s hold - Heel Toe Raises with Counter Support  - 2 x daily - 7 x weekly - 2 sets - 10 reps - 3s hold - Lunge with Counter Support  - 2 x daily - 7 x weekly - 2 sets - 10 reps   ASSESSMENT: CLINICAL IMPRESSION: Continued with adaptation, habituation, and balance exercises during session today.  Overall pt is demonstrating improved stability since starting with therapy. She reports notable improvement as well as increase in her confidence. Her ABC has increased from 48.1% at initial evaluation to 65%. BERG improved from 51/56 to 52/56 and FGA improved from 17/30 to 24/30. Her FOTO improved from 50 to 38. HEP updated with patient during session today. Plan is to continue at a frequency of once/week until she can get in to see neurology. Pt encouraged to follow-up as scheduled. She will benefit from skilled PT to address above impairments and improve overall function.   REHAB POTENTIAL: Fair    CLINICAL DECISION MAKING: Evolving/moderate complexity  EVALUATION COMPLEXITY: Moderate   GOALS: Goals reviewed with patient? No  SHORT TERM GOALS: Target date: 05/11/2022   Pt will be independent with HEP for dizziness in order to decrease symptoms, improve balance,decrease fall risk, and improve function at home. Baseline: Goal status: ONGOING   LONG TERM GOALS: Target date: 06/08/2022   Pt will increase FOTO to at least 58 to demonstrate significant improvement in function at home related to dizziness.  Baseline: 02/15/22: 50 03/17/22: 53; 04/13/22: 53 Goal status: PARTIALLY MET  2.  Pt will improve FGA by at least 4 points in order to demonstrate clinically significant improvement in balance and decreased risk for falls.     Baseline: 02/15/22: To be completed; 02/17/22: 17/30; 04/13/22: 24/30 Goal status: ACHIEVED  3.  Pt will improve ABC by at least 67% in order to demonstrate clinically significant improvement in balance confidence.      Baseline: 02/15/22: To be completed; 02/17/22: 48.1%; 04/05/22: 65% Goal status: PARTIALLY MET  4. Pt will improve BERG by at least 3 points in order to demonstrate clinically significant improvement in balance and decreased risk for falls.     Baseline: 02/15/22: To be completed, 02/17/22: 51/56; 03/17/22: 52/56 Goal status: PARTIALLY MET   PLAN:  PT  FREQUENCY: 1-2x/week  PT DURATION: 8 weeks  PLANNED INTERVENTIONS: Therapeutic exercises, Therapeutic activity, Neuromuscular re-education, Balance training, Gait training, Patient/Family education, Joint manipulation, Joint mobilization, Canalith repositioning, Aquatic Therapy, Dry Needling, Cognitive remediation, Electrical stimulation, Spinal manipulation, Spinal mobilization, Cryotherapy, Moist heat, Traction, Ultrasound, Ionotophoresis 20m/ml Dexamethasone, and Manual therapy  PLAN FOR NEXT SESSION: continue adaptation, habituation, and balance exercises with patient;   JPhillips GroutPT, DPT, GCS  Julie Hunter 04/13/2022, 9:58 PM

## 2022-04-19 ENCOUNTER — Ambulatory Visit: Payer: Medicare Other

## 2022-04-19 DIAGNOSIS — R2681 Unsteadiness on feet: Secondary | ICD-10-CM | POA: Diagnosis not present

## 2022-04-19 NOTE — Therapy (Signed)
OUTPATIENT PHYSICAL THERAPY VESTIBULAR TREATMENT  Patient Name: Julie Hunter MRN: [id number] DOB:12/29/1955, 66 y.o., female Today's Date: 04/19/2022  PCP: Patient, No Pcp Per REFERRING PROVIDER: Molli Knock, MD   PT End of Session - 04/19/22 1022     Visit Number 18    Number of Visits 33    Date for PT Re-Evaluation 06/08/22    Authorization Type eval: 02/15/22    PT Start Time 1020    PT Stop Time 1100    PT Time Calculation (min) 40 min    Activity Tolerance Patient tolerated treatment well    Behavior During Therapy WFL for tasks assessed/performed            Past Medical History:  Diagnosis Date   Glaucoma    Past Surgical History:  Procedure Laterality Date   ROTATOR CUFF REPAIR Right    SHOULDER SURGERY Left    There are no problems to display for this patient.  PCP: Patient, No Pcp Per  REFERRING PROVIDER: Molli Knock, MD  REFERRING DIAGNOSIS: R42 (ICD-10-CM) - Dizziness and giddiness  THERAPY DIAG: Unsteadiness on feet  RATIONALE FOR EVALUATION AND TREATMENT: Rehabilitation  ONSET DATE: 03/10/2021 (approximate)  FOLLOW UP APPT WITH PROVIDER: No   FROM INITIAL EVALUATION SUBJECTIVE:   Chief Complaint:  Dizziness and unsteadiness  Pertinent History Pt referred secondary to complaints of occasional dizzy spells as well as imbalance. Symptoms first started sometime before last October and accelerated between her first and second COVID vaccination in the fall. She has noted progressive worsening of her balance as well as progressive difficulty with her short term memory. Pt now tries to hold her husbands arm when walking to facilitate balance and states that if she is holding his arm she is stable. Symptoms never occur when laying down, rolling in bed, or looking up. She denies any true vertigo. 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 Per ENT note pt had a blocked artery several years ago and was placed on baby  aspirin. She was also found to have bilateral sensorineural hearing loss and they are recommending hearing aids. Pt started noticing a blurry spot in her left eye in 2014 and while she was undergoing work-up by optho she had an MRI done which demonstrated brian atrophy, in particular the posterior fossa. She was referred to neurology and at that time she denied ever noticing any memory problems or gait difficulties. Per neurology note pt denied any family history of similar symptoms although mother did have dementia in late-life and father had what she thinks was PSP. She was followed by [facility] from 10/17/16 through 04/01/19. 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 Pt denies having seen neurology since 2020. According to the last neurology note there were initially concerns about a neurocognitive or neurodegenerative process. However, she has followed up for 2.5 years and she developed no abnormalities on her examination. 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 She was advised to follow up if she noticed any gait changes, falls, or more objective cognitive difficulties.   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  COMPARISON: None   TECHNIQUE/PROTOCOL: Standard orbits pre and post contrast protocol MRI  performed, including additional imaging pre and post contrast of the brain.   CONTRAST: 70m MultiHance IV. This MRI was performed before and after IV  administration of contrast material. IV contrast was administered to  improve disease detection  and further define anatomy.  *    Complications:  No immediate patient complications or events noted.   FINDINGS:  Globes: Normal.  Lenses: Normal appearing native lenses.  Optic nerves: Normal.  Intraconal fat: Normal.  Intraocular hemorrhage: Normal.  Extraocular muscles: Normal.  Intraorbital vasculature: Normal.  Lacrimal glands: Normal.   Cerebrum: No mass, mass effect, acute infarct or hemorrhage. Markedly  advanced for age global cerebral atrophy, which is disproportionately worse  within the pons and medulla.  Intracranial flow voids: Normal.  Extra-axial spaces, basal cisterns, ventricles and sulci: Normal.  Cranium: Normal.  Paranasal sinuses: Normal.  Mastoid sinuses: Normal.   IMPRESSION:  1. Normal MRI of the orbits.  2. 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 This may be seen in multisystem atrophy, but  other etiologies are possible. Correlate clinically.   Description of dizziness: unsteadiness and whoozy Frequency: Multiple times throughout the day Duration: dizziness a few seconds but constant unsteadiness Symptom nature: intermittent, spontaneous, and positional Progression of symptoms since onset: worse, increasing frequency History of similar episodes: No  Provocative Factors: bending forward, sit to stand, turning quickly, no problems with laying down or rolling in bed Easing Factors:  waiting for symptoms to pass.  Auditory complaints (tinnitus, pain, drainage, hearing loss, aural fullness): Yes, pt reports hearing loss and occasional aural fullness but otherwise no changes; Vision changes (diplopia, visual field loss, recent changes, recent eye exam): Yes, chronic double vision intermittently when fatigued (in the last couple months) Chest pain/palpitations: No History of head injury/concussion: No Stress/anxiety: No Numbness/tingling: Denies Focal weakness: Denies but reports progressive  weakness in grip strength; Headaches/migraines: Denies  Has patient fallen in last 6 months? Yes, Number of falls: 1 Pertinent pain: No Dominant hand: right Imaging: Yes  Prior level of function: Independent for ADLs/IADLs Occupational demands: Not working Hobbies: Pt has animals on her property for which she cares  Red Flags: Positive: occasional dysphagia, weight loss (approximately 40# over the last 2-3 years, poor appetite and partially accounted by intentional weight loss), Denies: dysarthria, bowel and bladder changes, chills, fever, or night sweats;  PRECAUTIONS: None  WEIGHT BEARING RESTRICTIONS No  LIVING ENVIRONMENT: Lives with: lives with their spouse Lives in: House/apartment, 2 stories Stairs: Yes; 12-15 stairs inside railing on L during ascend Has following equipment at home: None  PATIENT GOALS: Improve balance, pt wants to be able to walk to the mailbox (gravel driveway about 0.5 miles);   OBJECTIVE EXAMINATION  POSTURE: No gross deficits contributing to symptoms  NEUROLOGICAL SCREEN: (2+ unless otherwise noted.) N=normal  Ab=abnormal  Level Dermatome R L Myotome R L Reflex R L  C3 Anterior Neck N N Sidebend C2-3 N N Jaw CN V    C4 Top of Shoulder N N Shoulder Shrug C4 N N Hoffman's UMN    C5 Lateral Upper Arm N N Shoulder ABD C4-5 N N Biceps C5-6    C6 Lateral Arm/ Thumb N N Arm Flex/ Wrist Ext C5-6 N N Brachiorad. C5-6    C7 Middle Finger N N Arm Ext//Wrist Flex C6-7 N N Triceps C7    C8 4th & 5th Finger N  N Flex/ Ext Carpi Ulnaris C8 N N Patellar (L3-4)    T1 Medial Arm N N Interossei T1 N N Gastrocnemius    L2 Medial thigh/groin N N Illiopsoas (L2-3) N N     L3 Lower thigh/med.knee N N Quadriceps (L3-4) N N     L4 Medial leg/lat thigh N N Tibialis Ant (L4-5) N N     L5 Lat. leg & dorsal foot N N EHL (L5) N N     S1 post/lat foot/thigh/leg N N Gastrocnemius (S1-2) N N     S2 Post./med. thigh & leg N N Hamstrings (L4-S3) N N      CRANIAL NERVES II,  III, IV, VI: Pupils equal and reactive to light, visual acuity and visual fields are intact, extraocular muscles are intact  V: Facial sensation is intact and symmetric bilaterally  VII: Facial strength is intact and symmetric bilaterally  VIII: Hearing is normal as tested by gross conversation IX, X: Palate elevates midline, normal phonation, uvula midline XI: Shoulder shrug strength is intact  XII: Tongue protrudes midline   SOMATOSENSORY Grossly intact to light touch bilateral UEs/LEs as determined by testing dermatomes C2-T2 and L2-S2. Proprioception and hot/cold testing deferred on this date.  COORDINATION Finger to Nose: Dysmetria bilaterally Heel to Shin: Normal Pronator Drift: Positive on the right Rapid Alternating Movements: Normal Finger to Thumb Opposition: Normal Ataxic gait noted with wide base of support and pt leaning slightly backwards   RANGE OF MOTION Cervical Spine AROM WFL and painless in all planes. No focal deficits in AROM noted in BUE/BLE  MANUAL MUSCLE TESTING BUE/BLE strength WNL without focal deficits with the exception of 4/5 bilateral hip flexion, 4/5 bilateral ankle dorsiflexion, and decreased bilateral grip strength;  TRANSFERS/GAIT Independent for transfers and ambulation without assistive device   PATIENT SURVEYS FOTO: 50, predicted improvement to 58 ABC: To be completed, 02/17/22: 48.1%  POSTURAL CONTROL TESTS  Clinical Test of Sensory Interaction for Balance (CTSIB): Deferred  OCULOMOTOR / VESTIBULAR TESTING  Oculomotor Exam- Room Light  Findings Comments  Ocular Alignment normal   Ocular ROM normal   Spontaneous Nystagmus normal   Gaze-Holding Nystagmus normal   End-Gaze Nystagmus normal   Vergence (normal 2-3") not examined   Smooth Pursuit abnormal Saccadic  Cross-Cover Test not examined   Saccades abnormal Slow and consistently hypometric  VOR Cancellation abnormal Consistent saccades  Left Head Impulse abnormal Corrective  saccade required  Right Head Impulse normal   Static Acuity normal (02/23/22) 20/25 (line 7)  Dynamic Acuity abnormal (02/23/22) 20/70 (line 3)   Oculomotor Exam- Fixation Suppressed: Deferred  BPPV TESTS:  Symptoms Duration Intensity Nystagmus  L Dix-Hallpike None   Downbeating (does not fatigue)  R Dix-Hallpike Oscillopsia 10-15s  Downbeating (does not fatigue)  L Head Roll None   None  R Head Roll None   None  L Sidelying Test      R Sidelying Test      (blank = not tested)  Modified Clinical Test of Sensory Interaction for Balance (mCTSIB), 02/17/22:  CONDITION TIME SWAY  Eyes open, firm surface 30 seconds 2+  Eyes closed, firm surface 30 seconds 3+  Eyes open, foam surface 30 seconds 3+  Eyes closed, foam surface 6 seconds 4+   Oculomotor Exam- Fixation Suppressed (02/17/22)  Findings Comments  Ocular Alignment normal   Spontaneous Nystagmus abnormal R horizontal beating nystagmus  Gaze-Holding Nystagmus abnormal Worsening R horizontal beating nystagmus with R gaze, Downbeating nystagmus with L gaze  End-Gaze Nystagmus  abnormal See above  Head Shaking Nystagmus No change   Pressure-Induced Nystagmus No change   Hyperventilation Induced Nystagmus not examined   Skull Vibration Induced Nystagmus not examined    FUNCTIONAL OUTCOME MEASURES   02/17/22 02/21/22 Comments  BERG 51/56  Mild balance deficit  DGI     FGA 17/30  Severe dynamic instability  TUG     5TSTS 8.9s  WNL  6 Minute Walk Test     10 Meter Gait Speed     MOCA  24/30   (blank = not tested)    TODAY'S TREATMENT    SUBJECTIVE: Pt reports that she is doing well today. She has been performing her HEP without issue. No falls since the last therapy session. She continues to report improvement in her balance and BLE strength since starting with therapy. No pain upon arrival today. No specific questions currently.    PAIN: Denies   Neuromuscular Re-education  NuStep L2-3 x 5 minutes for warm-up during  interval history, BLE and LUE (2 minutes unbilled); Forward/retro gait in hallway with vertical ball lifts with head/eye follow 2 x 70' each direction; Forward/retro gait in hallway with horizontal ball passes between hands with head/eye follow 2 x 70' each direction; Forward gait in hallway with ball passes from patient to therapist with head/eye follow 2 x 70' to each side; Forward gait in hallway with ball bounces from patient to therapist with head/eye follow x 70' to each side; 1/2 foam roll (flat side up) static balance x 60s; 1/2 foam roll (flat side up) heel/toe weight shifting x 10 each direction; 1/2 foam roll (flat side up) tandem balance alternating forward LE x 60s with each foot forward; Standing heel/toe raises 3s hold x 10 each direction; Split squats with single UE support x 10 BLE;    Not Performed: Squats with glute chair taps 2 x 10; Sit to stand without UE support and Airex pad under feet with horizontal head turns x 10, vertical head turns x 10; Single leg balance x 30s on each LE;  Tandem gait forward with horizontal and vertical head turns 10' x 2 each; Tandem gait forward/backward 10' x 4 each; Alternating BOSU step-ups (round side up) x 10 leading with each LE; Airex feet together eyes open/closed x 30s each; Airex feet together horizontal and vertical head turns x 30s each; Airex balance beam tandem gait forward/backward 8' x 4 each direction;; Airex balance beam tandem balance alternating forward LE x 30s each; Airex balance beam tandem balance alternating forward LE with horizontal and vertical head turns x 30s each; Airex balance beam side stepping 8' x 4; Airex balance beam side stepping with horizontal followed by vertical head turns 8' x 4 each; Airex feet together horizontal ball passes around body to therapist with ball return on opposite side at varying heights/distances, pt performing head/eye follow x multiple bouts on each side; Agility ladder lateral  stepping 2 x 15'; Alternating 12" step taps x 10 BLE; Alternating 6" cone taps x 10 BLE; Alternating 6" cone taps on 12" step x 10 BLE; Forward/retro gait with VOR x 1 horizontal and vertical x 70' each for both directions; Forward gait in // bars stepping over 6" obstacles x multiple bouts; Lateral gait in // bars stepping over 6" obstacles x multiple bouts; Tandem gait in // bars stepping over 6" obstacles x multiple bouts;   PATIENT EDUCATION:  Education details: Balance exercises Person educated: Patient Education method: Explanation, verbal cues, tactile cues, and  handout Education comprehension: verbalized understanding and returned demonstration   HOME EXERCISE PROGRAM: Access Code: [email or web address] URL: [email or web address] Date: 04/13/2022 Prepared by: Roxana Hires  Exercises - Standing Tandem Balance with Counter Support  - 2 x daily - 7 x weekly - 1 reps - 60 hold - Standing Tandem Balance with Counter Support (Mirrored)  - 2 x daily - 7 x weekly - 1 reps - 60 hold - Standing Single Leg Stance with Counter Support  - 2 x daily - 7 x weekly - 1 reps - 60 hold - Standing Single Leg Stance with Counter Support (Mirrored)  - 2 x daily - 7 x weekly - 1 reps - 60 hold - Corner Balance Feet Together With Eyes Closed  - 2 x daily - 7 x weekly - 1 reps - 60s hold - Corner Balance Feet Together: Eyes Closed With Head Turns  - 2 x daily - 7 x weekly - 1 reps - 60 hold - Standing Gaze Stabilization with Head Rotation  - 2 x daily - 7 x weekly - 3 reps - 60s hold - Tandem Walking Next to Counter  - 2 x daily - 7 x weekly - 1 reps - 60s hold - Carioca with Counter Support  - 2 x daily - 7 x weekly - 1 reps - 60s hold - Heel Toe Raises with Counter Support  - 2 x daily - 7 x weekly - 2 sets - 10 reps - 3s hold - Lunge with Counter Support  - 2 x daily - 7 x weekly - 2 sets - 10 reps   ASSESSMENT: CLINICAL IMPRESSION: Continued with adaptation, habituation, and balance  exercises during session today. Overall pt is demonstrating improved stability and LE strength since starting with therapy. Utilized half foam roll today to challenge pt on unstable surface. Plan is to continue at a frequency of once/week until her neurology appointment. No HEP modifications on this date. Pt encouraged to follow-up as scheduled. She will benefit from skilled PT to address above impairments and improve overall function.   REHAB POTENTIAL: Fair    CLINICAL DECISION MAKING: Evolving/moderate complexity  EVALUATION COMPLEXITY: Moderate   GOALS: Goals reviewed with patient? No  SHORT TERM GOALS: Target date: 05/11/2022   Pt will be independent with HEP for dizziness in order to decrease symptoms, improve balance,decrease fall risk, and improve function at home. Baseline: Goal status: ONGOING   LONG TERM GOALS: Target date: 06/08/2022   Pt will increase FOTO to at least 58 to demonstrate significant improvement in function at home related to dizziness.  Baseline: 02/15/22: 50 03/17/22: 53; 04/13/22: 53 Goal status: PARTIALLY MET  2.  Pt will improve FGA by at least 4 points in order to demonstrate clinically significant improvement in balance and decreased risk for falls.     Baseline: 02/15/22: To be completed; 02/17/22: 17/30; 04/13/22: 24/30 Goal status: ACHIEVED  3.  Pt will improve ABC by at least 67% in order to demonstrate clinically significant improvement in balance confidence.      Baseline: 02/15/22: To be completed; 02/17/22: 48.1%; 04/05/22: 65% Goal status: PARTIALLY MET  4. Pt will improve BERG by at least 3 points in order to demonstrate clinically significant improvement in balance and decreased risk for falls.     Baseline: 02/15/22: To be completed, 02/17/22: 51/56; 03/17/22: 52/56 Goal status: PARTIALLY MET   PLAN:  PT FREQUENCY: 1-2x/week  PT DURATION: 8 weeks  PLANNED INTERVENTIONS: Therapeutic exercises, Therapeutic activity, Neuromuscular re-education,  Balance  training, Gait training, Patient/Family education, Joint manipulation, Joint mobilization, Canalith repositioning, Aquatic Therapy, Dry Needling, Cognitive remediation, Electrical stimulation, Spinal manipulation, Spinal mobilization, Cryotherapy, Moist heat, Traction, Ultrasound, Ionotophoresis 18m/ml Dexamethasone, and Manual therapy  PLAN FOR NEXT SESSION: continue adaptation, habituation, and balance exercises with patient;   JPhillips GroutPT, DPT, GCS  Thales Knipple 04/19/2022, 2:02 PM

## 2022-04-26 ENCOUNTER — Ambulatory Visit: Payer: Medicare Other

## 2022-04-26 DIAGNOSIS — R2681 Unsteadiness on feet: Secondary | ICD-10-CM

## 2022-04-26 NOTE — Therapy (Signed)
OUTPATIENT PHYSICAL THERAPY VESTIBULAR TREATMENT  Patient Name: Julie Hunter MRN: [id number] DOB:01/23/1956, 66 y.o., female Today's Date: 04/27/2022  PCP: Patient, No Pcp Per REFERRING PROVIDER: Molli Knock, MD   PT End of Session - 04/26/22 1016     Visit Number 19    Number of Visits 33    Date for PT Re-Evaluation 06/08/22    Authorization Type eval: 02/15/22    PT Start Time 1016    PT Stop Time 1100    PT Time Calculation (min) 44 min    Activity Tolerance Patient tolerated treatment well    Behavior During Therapy WFL for tasks assessed/performed            Past Medical History:  Diagnosis Date   Glaucoma    Past Surgical History:  Procedure Laterality Date   ROTATOR CUFF REPAIR Right    SHOULDER SURGERY Left    There are no problems to display for this patient.  PCP: Patient, No Pcp Per  REFERRING PROVIDER: Molli Knock, MD  REFERRING DIAGNOSIS: R42 (ICD-10-CM) - Dizziness and giddiness  THERAPY DIAG: Unsteadiness on feet  RATIONALE FOR EVALUATION AND TREATMENT: Rehabilitation  ONSET DATE: 03/10/2021 (approximate)  FOLLOW UP APPT WITH PROVIDER: No   FROM INITIAL EVALUATION SUBJECTIVE:   Chief Complaint:  Dizziness and unsteadiness  Pertinent History Pt referred secondary to complaints of occasional dizzy spells as well as imbalance. Symptoms first started sometime before last October and accelerated between her first and second COVID vaccination in the fall. She has noted progressive worsening of her balance as well as progressive difficulty with her short term memory. Pt now tries to hold her husbands arm when walking to facilitate balance and states that if she is holding his arm she is stable. Symptoms never occur when laying down, rolling in bed, or looking up. She denies any true vertigo. 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 Per ENT note pt had a blocked artery several years ago and was placed on baby  aspirin. She was also found to have bilateral sensorineural hearing loss and they are recommending hearing aids. Pt started noticing a blurry spot in her left eye in 2014 and while she was undergoing work-up by optho she had an MRI done which demonstrated brian atrophy, in particular the posterior fossa. She was referred to neurology and at that time she denied ever noticing any memory problems or gait difficulties. Per neurology note pt denied any family history of similar symptoms although mother did have dementia in late-life and father had what she thinks was PSP. She was followed by [place] neurology from 10/17/16 through 04/01/19. 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 Pt denies having seen neurology since 2020. According to the last neurology note there were initially concerns about a neurocognitive or neurodegenerative process. However, she has followed up for 2.5 years and she developed no abnormalities on her examination. 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 She was advised to follow up if she noticed any gait changes, falls, or more objective cognitive difficulties.   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  COMPARISON: None   TECHNIQUE/PROTOCOL: Standard orbits pre and post contrast protocol MRI  performed, including additional imaging pre and post contrast of the brain.   CONTRAST: 48m MultiHance IV. This MRI was performed before and after IV  administration of contrast material. IV contrast was administered to  improve disease detection  and further define anatomy.  *    Complications:  No immediate patient complications or events noted.   FINDINGS:  Globes: Normal.  Lenses: Normal appearing native lenses.  Optic nerves: Normal.  Intraconal fat: Normal.  Intraocular hemorrhage: Normal.  Extraocular muscles: Normal.  Intraorbital vasculature: Normal.  Lacrimal glands: Normal.   Cerebrum: No mass, mass effect, acute infarct or hemorrhage. Markedly  advanced for age global cerebral atrophy, which is disproportionately worse  within the pons and medulla.  Intracranial flow voids: Normal.  Extra-axial spaces, basal cisterns, ventricles and sulci: Normal.  Cranium: Normal.  Paranasal sinuses: Normal.  Mastoid sinuses: Normal.   IMPRESSION:  1. Normal MRI of the orbits.  2. 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 This may be seen in multisystem atrophy, but  other etiologies are possible. Correlate clinically.   Description of dizziness: unsteadiness and whoozy Frequency: Multiple times throughout the day Duration: dizziness a few seconds but constant unsteadiness Symptom nature: intermittent, spontaneous, and positional Progression of symptoms since onset: worse, increasing frequency History of similar episodes: No  Provocative Factors: bending forward, sit to stand, turning quickly, no problems with laying down or rolling in bed Easing Factors:  waiting for symptoms to pass.  Auditory complaints (tinnitus, pain, drainage, hearing loss, aural fullness): Yes, pt reports hearing loss and occasional aural fullness but otherwise no changes; Vision changes (diplopia, visual field loss, recent changes, recent eye exam): Yes, chronic double vision intermittently when fatigued (in the last couple months) Chest pain/palpitations: No History of head injury/concussion: No Stress/anxiety: No Numbness/tingling: Denies Focal weakness: Denies but reports progressive  weakness in grip strength; Headaches/migraines: Denies  Has patient fallen in last 6 months? Yes, Number of falls: 1 Pertinent pain: No Dominant hand: right Imaging: Yes  Prior level of function: Independent for ADLs/IADLs Occupational demands: Not working Hobbies: Pt has animals on her property for which she cares  Red Flags: Positive: occasional dysphagia, weight loss (approximately 40# over the last 2-3 years, poor appetite and partially accounted by intentional weight loss), Denies: dysarthria, bowel and bladder changes, chills, fever, or night sweats;  PRECAUTIONS: None  WEIGHT BEARING RESTRICTIONS No  LIVING ENVIRONMENT: Lives with: lives with their spouse Lives in: House/apartment, 2 stories Stairs: Yes; 12-15 stairs inside railing on L during ascend Has following equipment at home: None  PATIENT GOALS: Improve balance, pt wants to be able to walk to the mailbox (gravel driveway about 0.5 miles);   OBJECTIVE EXAMINATION  POSTURE: No gross deficits contributing to symptoms  NEUROLOGICAL SCREEN: (2+ unless otherwise noted.) N=normal  Ab=abnormal  Level Dermatome R L Myotome R L Reflex R L  C3 Anterior Neck N N Sidebend C2-3 N N Jaw CN V    C4 Top of Shoulder N N Shoulder Shrug C4 N N Hoffman's UMN    C5 Lateral Upper Arm N N Shoulder ABD C4-5 N N Biceps C5-6    C6 Lateral Arm/ Thumb N N Arm Flex/ Wrist Ext C5-6 N N Brachiorad. C5-6    C7 Middle Finger N N Arm Ext//Wrist Flex C6-7 N N Triceps C7    C8 4th & 5th Finger N  N Flex/ Ext Carpi Ulnaris C8 N N Patellar (L3-4)    T1 Medial Arm N N Interossei T1 N N Gastrocnemius    L2 Medial thigh/groin N N Illiopsoas (L2-3) N N     L3 Lower thigh/med.knee N N Quadriceps (L3-4) N N     L4 Medial leg/lat thigh N N Tibialis Ant (L4-5) N N     L5 Lat. leg & dorsal foot N N EHL (L5) N N     S1 post/lat foot/thigh/leg N N Gastrocnemius (S1-2) N N     S2 Post./med. thigh & leg N N Hamstrings (L4-S3) N N      CRANIAL NERVES II,  III, IV, VI: Pupils equal and reactive to light, visual acuity and visual fields are intact, extraocular muscles are intact  V: Facial sensation is intact and symmetric bilaterally  VII: Facial strength is intact and symmetric bilaterally  VIII: Hearing is normal as tested by gross conversation IX, X: Palate elevates midline, normal phonation, uvula midline XI: Shoulder shrug strength is intact  XII: Tongue protrudes midline   SOMATOSENSORY Grossly intact to light touch bilateral UEs/LEs as determined by testing dermatomes C2-T2 and L2-S2. Proprioception and hot/cold testing deferred on this date.  COORDINATION Finger to Nose: Dysmetria bilaterally Heel to Shin: Normal Pronator Drift: Positive on the right Rapid Alternating Movements: Normal Finger to Thumb Opposition: Normal Ataxic gait noted with wide base of support and pt leaning slightly backwards   RANGE OF MOTION Cervical Spine AROM WFL and painless in all planes. No focal deficits in AROM noted in BUE/BLE  MANUAL MUSCLE TESTING BUE/BLE strength WNL without focal deficits with the exception of 4/5 bilateral hip flexion, 4/5 bilateral ankle dorsiflexion, and decreased bilateral grip strength;  TRANSFERS/GAIT Independent for transfers and ambulation without assistive device   PATIENT SURVEYS FOTO: 50, predicted improvement to 58 ABC: To be completed, 02/17/22: 48.1%  POSTURAL CONTROL TESTS  Clinical Test of Sensory Interaction for Balance (CTSIB): Deferred  OCULOMOTOR / VESTIBULAR TESTING  Oculomotor Exam- Room Light  Findings Comments  Ocular Alignment normal   Ocular ROM normal   Spontaneous Nystagmus normal   Gaze-Holding Nystagmus normal   End-Gaze Nystagmus normal   Vergence (normal 2-3") not examined   Smooth Pursuit abnormal Saccadic  Cross-Cover Test not examined   Saccades abnormal Slow and consistently hypometric  VOR Cancellation abnormal Consistent saccades  Left Head Impulse abnormal Corrective  saccade required  Right Head Impulse normal   Static Acuity normal (02/23/22) 20/25 (line 7)  Dynamic Acuity abnormal (02/23/22) 20/70 (line 3)   Oculomotor Exam- Fixation Suppressed: Deferred  BPPV TESTS:  Symptoms Duration Intensity Nystagmus  L Dix-Hallpike None   Downbeating (does not fatigue)  R Dix-Hallpike Oscillopsia 10-15s  Downbeating (does not fatigue)  L Head Roll None   None  R Head Roll None   None  L Sidelying Test      R Sidelying Test      (blank = not tested)  Modified Clinical Test of Sensory Interaction for Balance (mCTSIB), 02/17/22:  CONDITION TIME SWAY  Eyes open, firm surface 30 seconds 2+  Eyes closed, firm surface 30 seconds 3+  Eyes open, foam surface 30 seconds 3+  Eyes closed, foam surface 6 seconds 4+   Oculomotor Exam- Fixation Suppressed (02/17/22)  Findings Comments  Ocular Alignment normal   Spontaneous Nystagmus abnormal R horizontal beating nystagmus  Gaze-Holding Nystagmus abnormal Worsening R horizontal beating nystagmus with R gaze, Downbeating nystagmus with L gaze  End-Gaze Nystagmus  abnormal See above  Head Shaking Nystagmus No change   Pressure-Induced Nystagmus No change   Hyperventilation Induced Nystagmus not examined   Skull Vibration Induced Nystagmus not examined    FUNCTIONAL OUTCOME MEASURES   02/17/22 02/21/22 Comments  BERG 51/56  Mild balance deficit  DGI     FGA 17/30  Severe dynamic instability  TUG     5TSTS 8.9s  WNL  6 Minute Walk Test     10 Meter Gait Speed     MOCA  24/30   (blank = not tested)    TODAY'S TREATMENT    SUBJECTIVE: Pt reports that she is somewhat anxious today due to an issue with her credit card company over the last day. No falls since the last therapy session. No pain upon arrival today. No specific questions currently.    PAIN: Denies   Neuromuscular Re-education  NuStep L2-3 x 5 minutes for warm-up during interval history, BLE and LUE (2 minutes unbilled); Tandem gait  forward/backward 2 x 15' each; 1/2 foam roll (round side up) static balance x 60s; 1/2 foam roll (round side up) horizontal and vertical head turns x 30s each; 1/2 foam roll (flat side up) static balance x 60s; 1/2 foam roll (flat side up) heel/toe weight shifting x 10 each direction; 1/2 foam roll (flat side up) tandem balance alternating forward LE x 60s with each foot forward; Forward/retro gait with VOR x 1 horizontal and vertical x 70' each for both directions; Forward/retro gait in hallway with horizontal ball passes between hands with head/eye follow 2 x 70' each direction, added cognitive task during second rep each direction; Forward gait in hallway with ball passes from patient to therapist with head/eye follow 2 x 70' to each side;   Not Performed: Squats with glute chair taps 2 x 10; Sit to stand without UE support and Airex pad under feet with horizontal head turns x 10, vertical head turns x 10; Single leg balance x 30s on each LE;  Standing heel/toe raises 3s hold x 10 each direction; Split squats with single UE support x 10 BLE;  Alternating BOSU step-ups (round side up) x 10 leading with each LE; Airex feet together eyes open/closed x 30s each; Airex feet together horizontal and vertical head turns x 30s each; Airex balance beam tandem gait forward/backward 8' x 4 each direction;; Airex balance beam tandem balance alternating forward LE x 30s each; Airex balance beam tandem balance alternating forward LE with horizontal and vertical head turns x 30s each; Airex balance beam side stepping 8' x 4; Airex balance beam side stepping with horizontal followed by vertical head turns 8' x 4 each; Airex feet together horizontal ball passes around body to therapist with ball return on opposite side at varying heights/distances, pt performing head/eye follow x multiple bouts on each side; Agility ladder lateral stepping 2 x 15'; Alternating 12" step taps x 10 BLE; Alternating 6" cone  taps x 10 BLE; Alternating 6" cone taps on 12" step x 10 BLE; Forward gait in // bars stepping over 6" obstacles x multiple bouts; Lateral gait in // bars stepping over 6" obstacles x multiple bouts; Tandem gait in // bars stepping over 6" obstacles x multiple bouts; Tandem gait forward with horizontal and vertical head turns 10' x 2 each; Forward/retro gait in hallway with vertical ball lifts with head/eye follow 2 x 70' each direction; Forward gait in hallway with ball bounces from patient to therapist with head/eye follow x 70' to  each side;    PATIENT EDUCATION:  Education details: Balance exercises Person educated: Patient Education method: Explanation, verbal cues, tactile cues, and handout Education comprehension: verbalized understanding and returned demonstration   HOME EXERCISE PROGRAM: Access Code: [email or web address] URL: [email or web address] Date: 04/13/2022 Prepared by: Roxana Hires  Exercises - Standing Tandem Balance with Counter Support  - 2 x daily - 7 x weekly - 1 reps - 60 hold - Standing Tandem Balance with Counter Support (Mirrored)  - 2 x daily - 7 x weekly - 1 reps - 60 hold - Standing Single Leg Stance with Counter Support  - 2 x daily - 7 x weekly - 1 reps - 60 hold - Standing Single Leg Stance with Counter Support (Mirrored)  - 2 x daily - 7 x weekly - 1 reps - 60 hold - Corner Balance Feet Together With Eyes Closed  - 2 x daily - 7 x weekly - 1 reps - 60s hold - Corner Balance Feet Together: Eyes Closed With Head Turns  - 2 x daily - 7 x weekly - 1 reps - 60 hold - Standing Gaze Stabilization with Head Rotation  - 2 x daily - 7 x weekly - 3 reps - 60s hold - Tandem Walking Next to Counter  - 2 x daily - 7 x weekly - 1 reps - 60s hold - Carioca with Counter Support  - 2 x daily - 7 x weekly - 1 reps - 60s hold - Heel Toe Raises with Counter Support  - 2 x daily - 7 x weekly - 2 sets - 10 reps - 3s hold - Lunge with Counter Support  - 2 x daily - 7 x  weekly - 2 sets - 10 reps   ASSESSMENT: CLINICAL IMPRESSION: Continued with adaptation, habituation, and balance exercises during session today. Overall pt is demonstrating improved stability and LE strength since starting with therapy. Utilized half foam roll again today to challenge pt on unstable surface. Plan is to continue at a frequency of once/week until her neurology appointment. No HEP modifications on this date. Pt encouraged to follow-up as scheduled. She will benefit from skilled PT to address above impairments and improve overall function.   REHAB POTENTIAL: Fair    CLINICAL DECISION MAKING: Evolving/moderate complexity  EVALUATION COMPLEXITY: Moderate   GOALS: Goals reviewed with patient? No  SHORT TERM GOALS: Target date: 05/11/2022   Pt will be independent with HEP for dizziness in order to decrease symptoms, improve balance,decrease fall risk, and improve function at home. Baseline: Goal status: ONGOING   LONG TERM GOALS: Target date: 06/08/2022   Pt will increase FOTO to at least 58 to demonstrate significant improvement in function at home related to dizziness.  Baseline: 02/15/22: 50 03/17/22: 53; 04/13/22: 53 Goal status: PARTIALLY MET  2.  Pt will improve FGA by at least 4 points in order to demonstrate clinically significant improvement in balance and decreased risk for falls.     Baseline: 02/15/22: To be completed; 02/17/22: 17/30; 04/13/22: 24/30 Goal status: ACHIEVED  3.  Pt will improve ABC by at least 67% in order to demonstrate clinically significant improvement in balance confidence.      Baseline: 02/15/22: To be completed; 02/17/22: 48.1%; 04/05/22: 65% Goal status: PARTIALLY MET  4. Pt will improve BERG by at least 3 points in order to demonstrate clinically significant improvement in balance and decreased risk for falls.     Baseline: 02/15/22: To be completed, 02/17/22: 51/56; 03/17/22: 52/56 Goal status: PARTIALLY  MET   PLAN:  PT FREQUENCY:  1-2x/week  PT DURATION: 8 weeks  PLANNED INTERVENTIONS: Therapeutic exercises, Therapeutic activity, Neuromuscular re-education, Balance training, Gait training, Patient/Family education, Joint manipulation, Joint mobilization, Canalith repositioning, Aquatic Therapy, Dry Needling, Cognitive remediation, Electrical stimulation, Spinal manipulation, Spinal mobilization, Cryotherapy, Moist heat, Traction, Ultrasound, Ionotophoresis 16m/ml Dexamethasone, and Manual therapy  PLAN FOR NEXT SESSION: continue adaptation, habituation, and balance exercises with patient;   JLyndel SafeHuprich PT, DPT, GCS  Ngan Qualls 04/27/2022, 8:30 AM

## 2022-05-03 ENCOUNTER — Ambulatory Visit: Payer: Medicare Other

## 2022-05-03 DIAGNOSIS — R42 Dizziness and giddiness: Secondary | ICD-10-CM

## 2022-05-03 DIAGNOSIS — R2681 Unsteadiness on feet: Secondary | ICD-10-CM

## 2022-05-03 NOTE — Therapy (Signed)
OUTPATIENT PHYSICAL THERAPY VESTIBULAR TREATMENT/PROGRESS NOTE  Dates of reporting period  03/17/22   to   05/03/22   Patient Name: Julie Hunter MRN: [id number] DOB:[birth year], 66 y.o., female Today's Date: 05/03/2022  PCP: Patient, No Pcp Per REFERRING PROVIDER: Molli Knock, MD   PT End of Session - 05/03/22 1001     Visit Number 20    Number of Visits 33    Date for PT Re-Evaluation 06/08/22    Authorization Type eval: 02/15/22    PT Start Time 1010    PT Stop Time 1055    PT Time Calculation (min) 45 min    Activity Tolerance Patient tolerated treatment well    Behavior During Therapy WFL for tasks assessed/performed            Past Medical History:  Diagnosis Date   Glaucoma    Past Surgical History:  Procedure Laterality Date   ROTATOR CUFF REPAIR Right    SHOULDER SURGERY Left    There are no problems to display for this patient.  PCP: Patient, No Pcp Per  REFERRING PROVIDER: Molli Knock, MD  REFERRING DIAGNOSIS: R42 (ICD-10-CM) - Dizziness and giddiness  THERAPY DIAG: Unsteadiness on feet  Dizziness and giddiness  RATIONALE FOR EVALUATION AND TREATMENT: Rehabilitation  ONSET DATE: 03/10/2021 (approximate)  FOLLOW UP APPT WITH PROVIDER: No   FROM INITIAL EVALUATION SUBJECTIVE:   Chief Complaint:  Dizziness and unsteadiness  Pertinent History Pt referred secondary to complaints of occasional dizzy spells as well as imbalance. Symptoms first started sometime before last October and accelerated between her first and second COVID vaccination in the fall. She has noted progressive worsening of her balance as well as progressive difficulty with her short term memory. Pt now tries to hold her husbands arm when walking to facilitate balance and states that if she is holding his arm she is stable. Symptoms never occur when laying down, rolling in bed, or looking up. She denies any true vertigo. 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 Per ENT note pt had a blocked artery several years ago and was placed on baby aspirin. She was also found to have bilateral sensorineural hearing loss and they are recommending hearing aids. Pt started noticing a blurry spot in her left eye in 2014 and while she was undergoing work-up by optho she had an MRI done which demonstrated brian atrophy, in particular the posterior fossa. She was referred to neurology and at that time she denied ever noticing any memory problems or gait difficulties. Per neurology note pt denied any family history of similar symptoms although mother did have dementia in late-life and father had what she thinks was PSP. She was followed by [facility] from 10/17/16 through 04/01/19. 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 Pt denies having seen neurology since 2020. According to the last neurology note there were initially concerns about a neurocognitive or neurodegenerative process. However, she has followed up for 2.5 years and she developed no abnormalities on her examination. 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 She was advised to follow up if she noticed any gait changes, falls, or more objective cognitive difficulties.   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   COMPARISON: None   TECHNIQUE/PROTOCOL: Standard orbits pre and post contrast protocol MRI  performed, including additional imaging pre and post contrast of the brain.   CONTRAST: 27m MultiHance IV. This MRI was performed before and after IV   administration of contrast material. IV contrast was administered to  improve disease detection and further define anatomy.  *    Complications:  No immediate patient complications or events noted.   FINDINGS:  Globes: Normal.  Lenses: Normal appearing native lenses.  Optic nerves: Normal.  Intraconal fat: Normal.  Intraocular hemorrhage: Normal.  Extraocular muscles: Normal.  Intraorbital vasculature: Normal.  Lacrimal glands: Normal.   Cerebrum: No mass, mass effect, acute infarct or hemorrhage. Markedly  advanced for age global cerebral atrophy, which is disproportionately worse  within the pons and medulla.  Intracranial flow voids: Normal.  Extra-axial spaces, basal cisterns, ventricles and sulci: Normal.  Cranium: Normal.  Paranasal sinuses: Normal.  Mastoid sinuses: Normal.   IMPRESSION:  1. Normal MRI of the orbits.  2. 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 This may be seen in multisystem atrophy, but  other etiologies are possible. Correlate clinically.   Description of dizziness: unsteadiness and whoozy Frequency: Multiple times throughout the day Duration: dizziness a few seconds but constant unsteadiness Symptom nature: intermittent, spontaneous, and positional Progression of symptoms since onset: worse, increasing frequency History of similar episodes: No  Provocative Factors: bending forward, sit to stand, turning quickly, no problems with laying down or rolling in bed Easing Factors:  waiting for symptoms to pass.  Auditory complaints (tinnitus, pain, drainage, hearing loss, aural fullness): Yes, pt reports hearing loss and occasional aural fullness but otherwise no changes; Vision changes (diplopia, visual field loss, recent changes, recent eye exam): Yes, chronic double vision intermittently when fatigued (in the last couple months) Chest pain/palpitations: No History of head injury/concussion:  No Stress/anxiety: No Numbness/tingling: Denies Focal weakness: Denies but reports progressive weakness in grip strength; Headaches/migraines: Denies  Has patient fallen in last 6 months? Yes, Number of falls: 1 Pertinent pain: No Dominant hand: right Imaging: Yes  Prior level of function: Independent for ADLs/IADLs Occupational demands: Not working Hobbies: Pt has animals on her property for which she cares  Red Flags: Positive: occasional dysphagia, weight loss (approximately 40# over the last 2-3 years, poor appetite and partially accounted by intentional weight loss), Denies: dysarthria, bowel and bladder changes, chills, fever, or night sweats;  PRECAUTIONS: None  WEIGHT BEARING RESTRICTIONS No  LIVING ENVIRONMENT: Lives with: lives with their spouse Lives in: House/apartment, 2 stories Stairs: Yes; 12-15 stairs inside railing on L during ascend Has following equipment at home: None  PATIENT GOALS: Improve balance, pt wants to be able to walk to the mailbox (gravel driveway about 0.5 miles);   OBJECTIVE EXAMINATION  POSTURE: No gross deficits contributing to symptoms  NEUROLOGICAL SCREEN: (2+ unless otherwise noted.) N=normal  Ab=abnormal  Level Dermatome R L Myotome R L Reflex R L  C3 Anterior Neck N N Sidebend C2-3 N N Jaw CN V    C4 Top of Shoulder N N Shoulder Shrug C4 N N Hoffman's UMN    C5 Lateral Upper Arm N N Shoulder ABD C4-5 N N Biceps C5-6    C6 Lateral Arm/ Thumb N N Arm Flex/ Wrist Ext C5-6 N N Brachiorad. C5-6    C7 Middle  Finger N N Arm Ext//Wrist Flex C6-7 N N Triceps C7    C8 4th & 5th Finger N N Flex/ Ext Carpi Ulnaris C8 N N Patellar (L3-4)    T1 Medial Arm N N Interossei T1 N N Gastrocnemius    L2 Medial thigh/groin N N Illiopsoas (L2-3) N N     L3 Lower thigh/med.knee N N Quadriceps (L3-4) N N     L4 Medial leg/lat thigh N N Tibialis Ant (L4-5) N N     L5 Lat. leg & dorsal foot N N EHL (L5) N N     S1 post/lat foot/thigh/leg N N Gastrocnemius  (S1-2) N N     S2 Post./med. thigh & leg N N Hamstrings (L4-S3) N N      CRANIAL NERVES II, III, IV, VI: Pupils equal and reactive to light, visual acuity and visual fields are intact, extraocular muscles are intact  V: Facial sensation is intact and symmetric bilaterally  VII: Facial strength is intact and symmetric bilaterally  VIII: Hearing is normal as tested by gross conversation IX, X: Palate elevates midline, normal phonation, uvula midline XI: Shoulder shrug strength is intact  XII: Tongue protrudes midline   SOMATOSENSORY Grossly intact to light touch bilateral UEs/LEs as determined by testing dermatomes C2-T2 and L2-S2. Proprioception and hot/cold testing deferred on this date.  COORDINATION Finger to Nose: Dysmetria bilaterally Heel to Shin: Normal Pronator Drift: Positive on the right Rapid Alternating Movements: Normal Finger to Thumb Opposition: Normal Ataxic gait noted with wide base of support and pt leaning slightly backwards   RANGE OF MOTION Cervical Spine AROM WFL and painless in all planes. No focal deficits in AROM noted in BUE/BLE  MANUAL MUSCLE TESTING BUE/BLE strength WNL without focal deficits with the exception of 4/5 bilateral hip flexion, 4/5 bilateral ankle dorsiflexion, and decreased bilateral grip strength;  TRANSFERS/GAIT Independent for transfers and ambulation without assistive device   PATIENT SURVEYS FOTO: 50, predicted improvement to 58 ABC: To be completed, 02/17/22: 48.1%  POSTURAL CONTROL TESTS  Clinical Test of Sensory Interaction for Balance (CTSIB): Deferred  OCULOMOTOR / VESTIBULAR TESTING  Oculomotor Exam- Room Light  Findings Comments  Ocular Alignment normal   Ocular ROM normal   Spontaneous Nystagmus normal   Gaze-Holding Nystagmus normal   End-Gaze Nystagmus normal   Vergence (normal 2-3") not examined   Smooth Pursuit abnormal Saccadic  Cross-Cover Test not examined   Saccades abnormal Slow and consistently  hypometric  VOR Cancellation abnormal Consistent saccades  Left Head Impulse abnormal Corrective saccade required  Right Head Impulse normal   Static Acuity normal (02/23/22) 20/25 (line 7)  Dynamic Acuity abnormal (02/23/22) 20/70 (line 3)   Oculomotor Exam- Fixation Suppressed: Deferred  BPPV TESTS:  Symptoms Duration Intensity Nystagmus  L Dix-Hallpike None   Downbeating (does not fatigue)  R Dix-Hallpike Oscillopsia 10-15s  Downbeating (does not fatigue)  L Head Roll None   None  R Head Roll None   None  L Sidelying Test      R Sidelying Test      (blank = not tested)  Modified Clinical Test of Sensory Interaction for Balance (mCTSIB), 02/17/22:  CONDITION TIME SWAY  Eyes open, firm surface 30 seconds 2+  Eyes closed, firm surface 30 seconds 3+  Eyes open, foam surface 30 seconds 3+  Eyes closed, foam surface 6 seconds 4+   Oculomotor Exam- Fixation Suppressed (02/17/22)  Findings Comments  Ocular Alignment normal   Spontaneous Nystagmus abnormal R horizontal beating nystagmus  Gaze-Holding Nystagmus abnormal Worsening R horizontal beating nystagmus with R gaze, Downbeating nystagmus with L gaze  End-Gaze Nystagmus abnormal See above  Head Shaking Nystagmus No change   Pressure-Induced Nystagmus No change   Hyperventilation Induced Nystagmus not examined   Skull Vibration Induced Nystagmus not examined    FUNCTIONAL OUTCOME MEASURES   02/17/22 02/21/22 Comments  BERG 51/56  Mild balance deficit  DGI     FGA 17/30  Severe dynamic instability  TUG     5TSTS 8.9s  WNL  6 Minute Walk Test     10 Meter Gait Speed     MOCA  24/30   (blank = not tested)    TODAY'S TREATMENT    SUBJECTIVE: Pt reports that she is doing well today. No falls since the last therapy session. She has been incorporating more eyes closed exercises as part of her HEP and finds this very difficult. No pain upon arrival today. No specific questions currently.    PAIN: Denies   Neuromuscular  Re-education  NuStep L2-3 x 5 minutes for warm-up during interval history, BLE and LUE (2 minutes unbilled);  Updated outcome measures: BERG: 54/56 FGA: 24/30 FOTO: 58 ABC: 67.5%  1/2 foam roll (round side up) static balance x 60s; 1/2 foam roll (round side up) tandem balance alternating forward LE x 60s with each foot forward; 1/2 foam roll (flat side up) static balance x 60s; 1/2 foam roll (flat side up) tandem balance alternating forward LE x 60s with each foot forward; Forward/retro gait in hallway with vertical ball lifts with head/eye follow x 70' each direction; Forward/retro gait in hallway with horizontal ball passes between hands with head/eye follow x 70' each; Forward gait in hallway with ball passes from patient to therapist with head/eye follow x 70' each to both sides; Updated plan of care with patient;   PATIENT EDUCATION:  Education details: Balance exercises and plan of care Person educated: Patient Education method: Explanation, verbal cues, tactile cues Education comprehension: verbalized understanding and returned demonstration   HOME EXERCISE PROGRAM: Access Code: [email or web address] URL: [email or web address] Date: 04/13/2022 Prepared by: Roxana Hires  Exercises - Standing Tandem Balance with Counter Support  - 2 x daily - 7 x weekly - 1 reps - 60 hold - Standing Tandem Balance with Counter Support (Mirrored)  - 2 x daily - 7 x weekly - 1 reps - 60 hold - Standing Single Leg Stance with Counter Support  - 2 x daily - 7 x weekly - 1 reps - 60 hold - Standing Single Leg Stance with Counter Support (Mirrored)  - 2 x daily - 7 x weekly - 1 reps - 60 hold - Corner Balance Feet Together With Eyes Closed  - 2 x daily - 7 x weekly - 1 reps - 60s hold - Corner Balance Feet Together: Eyes Closed With Head Turns  - 2 x daily - 7 x weekly - 1 reps - 60 hold - Standing Gaze Stabilization with Head Rotation  - 2 x daily - 7 x weekly - 3 reps - 60s hold - Tandem  Walking Next to Counter  - 2 x daily - 7 x weekly - 1 reps - 60s hold - Carioca with Counter Support  - 2 x daily - 7 x weekly - 1 reps - 60s hold - Heel Toe Raises with Counter Support  - 2 x daily - 7 x weekly - 2 sets - 10 reps - 3s hold - Lunge with Counter Support  -  2 x daily - 7 x weekly - 2 sets - 10 reps   ASSESSMENT: CLINICAL IMPRESSION:  Continued with habituation and balance exercises during session today. Overall pt is demonstrating improved stability since starting with therapy. She reports notable improvement as well as increase in her confidence. Her ABC has increased from 48.1% at initial evaluation to 67.5%. BERG improved from 51/56 to 54/56 and FGA improved from 17/30 to 24/30. Her FOTO improved from 50 to 70. Plan is stop therapy for the next 2-3 weeks until she has her workup with neurology. She will return in mid November to check-in and if she is still doing well she will be discharged.   REHAB POTENTIAL: Fair    CLINICAL DECISION MAKING: Evolving/moderate complexity  EVALUATION COMPLEXITY: Moderate   GOALS: Goals reviewed with patient? No  SHORT TERM GOALS: Target date: 05/11/2022   Pt will be independent with HEP for dizziness in order to decrease symptoms, improve balance,decrease fall risk, and improve function at home. Baseline: Goal status: ONGOING   LONG TERM GOALS: Target date: 06/08/2022   Pt will increase FOTO to at least 58 to demonstrate significant improvement in function at home related to dizziness.  Baseline: 02/15/22: 50 03/17/22: 53; 04/13/22: 53; 05/03/22: 58 Goal status: ACHIEVED  2.  Pt will improve FGA by at least 4 points in order to demonstrate clinically significant improvement in balance and decreased risk for falls.     Baseline: 02/15/22: To be completed; 02/17/22: 17/30; 04/13/22: 24/30; 05/03/22: 24/30; Goal status: ACHIEVED  3.  Pt will improve ABC by at least 67% in order to demonstrate clinically significant improvement in balance  confidence.      Baseline: 02/15/22: To be completed; 02/17/22: 48.1%; 04/05/22: 65%; 05/03/22: 67.5% Goal status: ACHIEVED  4. Pt will improve BERG by at least 3 points in order to demonstrate clinically significant improvement in balance and decreased risk for falls.     Baseline: 02/15/22: To be completed, 02/17/22: 51/56; 03/17/22: 52/56; 05/03/22: 54/56 Goal status: ACHIEVED   PLAN:  PT FREQUENCY: 1-2x/week  PT DURATION: 8 weeks  PLANNED INTERVENTIONS: Therapeutic exercises, Therapeutic activity, Neuromuscular re-education, Balance training, Gait training, Patient/Family education, Joint manipulation, Joint mobilization, Canalith repositioning, Aquatic Therapy, Dry Needling, Cognitive remediation, Electrical stimulation, Spinal manipulation, Spinal mobilization, Cryotherapy, Moist heat, Traction, Ultrasound, Ionotophoresis '4mg'$ /ml Dexamethasone, and Manual therapy  PLAN FOR NEXT SESSION: continue adaptation, habituation, and balance exercises with patient, if symptoms remain improved pt will be discharged;   Lyndel Safe Ricard Faulkner PT, DPT, GCS  Jolan Mealor 05/03/2022, 1:51 PM

## 2022-05-11 ENCOUNTER — Other Ambulatory Visit: Payer: Self-pay

## 2022-05-11 DIAGNOSIS — R2689 Other abnormalities of gait and mobility: Secondary | ICD-10-CM

## 2022-05-11 DIAGNOSIS — R42 Dizziness and giddiness: Secondary | ICD-10-CM

## 2022-05-16 ENCOUNTER — Other Ambulatory Visit: Payer: Self-pay | Admitting: Neurology

## 2022-05-16 DIAGNOSIS — R42 Dizziness and giddiness: Secondary | ICD-10-CM

## 2022-05-16 DIAGNOSIS — R2689 Other abnormalities of gait and mobility: Secondary | ICD-10-CM

## 2022-06-06 ENCOUNTER — Ambulatory Visit
Admission: RE | Admit: 2022-06-06 | Discharge: 2022-06-06 | Disposition: A | Discharge status: Home / Self Care | Payer: Medicare Other | Source: Ambulatory Visit | Attending: Neurology | Admitting: Neurology

## 2022-06-06 DIAGNOSIS — R2689 Other abnormalities of gait and mobility: Secondary | ICD-10-CM

## 2022-06-06 DIAGNOSIS — R42 Dizziness and giddiness: Secondary | ICD-10-CM

## 2022-06-06 MED ORDER — GADOBENATE DIMEGLUMINE 529 MG/ML IV SOLN
11.0000 mL | Freq: Once | INTRAVENOUS | Status: AC | PRN
  Administered 2022-06-06: 11 mL via INTRAVENOUS
